# Patient Record
Sex: Female | Born: 1985 | Race: Black or African American | Hispanic: No | Marital: Married | Smoking: Former smoker
Health system: Southern US, Community
[De-identification: ages and names within clinical notes are randomized; demographics above are authoritative.]

## PROBLEM LIST (undated history)

## (undated) DIAGNOSIS — M549 Dorsalgia, unspecified: Secondary | ICD-10-CM

## (undated) DIAGNOSIS — M4304 Spondylolysis, thoracic region: Secondary | ICD-10-CM

## (undated) DIAGNOSIS — R110 Nausea: Secondary | ICD-10-CM

## (undated) DIAGNOSIS — S134XXA Sprain of ligaments of cervical spine, initial encounter: Secondary | ICD-10-CM

## (undated) DIAGNOSIS — T8131XA Disruption of external operation (surgical) wound, not elsewhere classified, initial encounter: Secondary | ICD-10-CM

## (undated) DIAGNOSIS — G894 Chronic pain syndrome: Secondary | ICD-10-CM

## (undated) DIAGNOSIS — G8929 Other chronic pain: Secondary | ICD-10-CM

## (undated) DIAGNOSIS — C801 Malignant (primary) neoplasm, unspecified: Secondary | ICD-10-CM

## (undated) DIAGNOSIS — Z923 Personal history of irradiation: Secondary | ICD-10-CM

## (undated) HISTORY — DX: Nausea: R11.0

## (undated) HISTORY — PX: CENTRAL VENOUS CATHETER TUNNELED INSERTION SINGLE LUMEN: SHX1325

---

## 2015-04-22 ENCOUNTER — Emergency Department (HOSPITAL_COMMUNITY)
Admission: EM | Admit: 2015-04-22 | Discharge: 2015-04-22 | Disposition: A | Discharge status: Home / Self Care | Payer: Medicaid Other | Attending: Emergency Medicine | Admitting: Emergency Medicine

## 2015-04-22 ENCOUNTER — Emergency Department (HOSPITAL_COMMUNITY): Payer: Medicaid Other

## 2015-04-22 DIAGNOSIS — Z3202 Encounter for pregnancy test, result negative: Secondary | ICD-10-CM | POA: Insufficient documentation

## 2015-04-22 DIAGNOSIS — F419 Anxiety disorder, unspecified: Secondary | ICD-10-CM | POA: Diagnosis not present

## 2015-04-22 DIAGNOSIS — R079 Chest pain, unspecified: Secondary | ICD-10-CM | POA: Insufficient documentation

## 2015-04-22 DIAGNOSIS — R55 Syncope and collapse: Secondary | ICD-10-CM | POA: Diagnosis not present

## 2015-04-22 LAB — CBC WITH DIFFERENTIAL/PLATELET
BASOS ABS: 0 10*3/uL (ref 0.0–0.1)
BASOS PCT: 0 %
EOS PCT: 1 %
Eosinophils Absolute: 0.1 10*3/uL (ref 0.0–0.7)
HCT: 37.8 % (ref 36.0–46.0)
Hemoglobin: 12.2 g/dL (ref 12.0–15.0)
LYMPHS PCT: 16 %
Lymphs Abs: 1 10*3/uL (ref 0.7–4.0)
MCH: 25.1 pg — ABNORMAL LOW (ref 26.0–34.0)
MCHC: 32.3 g/dL (ref 30.0–36.0)
MCV: 77.8 fL — AB (ref 78.0–100.0)
MONO ABS: 0.3 10*3/uL (ref 0.1–1.0)
Monocytes Relative: 4 %
Neutro Abs: 5.3 10*3/uL (ref 1.7–7.7)
Neutrophils Relative %: 79 %
PLATELETS: 341 10*3/uL (ref 150–400)
RBC: 4.86 MIL/uL (ref 3.87–5.11)
RDW: 20 % — AB (ref 11.5–15.5)
WBC: 6.7 10*3/uL (ref 4.0–10.5)

## 2015-04-22 LAB — URINE MICROSCOPIC-ADD ON

## 2015-04-22 LAB — URINALYSIS, ROUTINE W REFLEX MICROSCOPIC
Bilirubin Urine: NEGATIVE
GLUCOSE, UA: NEGATIVE mg/dL
HGB URINE DIPSTICK: NEGATIVE
Ketones, ur: NEGATIVE mg/dL
Nitrite: NEGATIVE
PH: 7.5 (ref 5.0–8.0)
PROTEIN: NEGATIVE mg/dL
SPECIFIC GRAVITY, URINE: 1.012 (ref 1.005–1.030)
Urobilinogen, UA: 0.2 mg/dL (ref 0.0–1.0)

## 2015-04-22 LAB — POC URINE PREG, ED: Preg Test, Ur: NEGATIVE

## 2015-04-22 LAB — COMPREHENSIVE METABOLIC PANEL
ALBUMIN: 4.2 g/dL (ref 3.5–5.0)
ALT: 19 U/L (ref 14–54)
AST: 22 U/L (ref 15–41)
Alkaline Phosphatase: 84 U/L (ref 38–126)
Anion gap: 10 (ref 5–15)
BUN: 12 mg/dL (ref 6–20)
CHLORIDE: 106 mmol/L (ref 101–111)
CO2: 22 mmol/L (ref 22–32)
CREATININE: 0.64 mg/dL (ref 0.44–1.00)
Calcium: 9.2 mg/dL (ref 8.9–10.3)
GFR calc Af Amer: >60 mL/min (ref >60)
GLUCOSE: 80 mg/dL (ref 65–99)
POTASSIUM: 4.1 mmol/L (ref 3.5–5.1)
SODIUM: 138 mmol/L (ref 135–145)
Total Bilirubin: 0.9 mg/dL (ref 0.3–1.2)
Total Protein: 8.1 g/dL (ref 6.5–8.1)

## 2015-04-22 LAB — I-STAT TROPONIN, ED: Troponin i, poc: 0 ng/mL (ref 0.00–0.08)

## 2015-04-22 MED ORDER — SODIUM CHLORIDE 0.9 % IV BOLUS (SEPSIS): 1000.0000 mL | Freq: Once | INTRAVENOUS | Status: DC

## 2015-04-22 NOTE — Discharge Instructions (Signed)
Your workup in the emergency room today was unremarkable. Please follow-up with your primary care provider within one week. Follow-up with [facility] surgery next week as scheduled. If you have any concerning symptoms such as high fever, difficulty breathing, seizures, etc. Return to the ER immediately.  Please obtain all of your results from medical records or have your doctors office obtain the results - share them with your doctor - you should be seen at your doctors office in the next 2 days. Call today to arrange your follow up. Take the medications as prescribed. Please review all of the medicines and only take them if you do not have an allergy to them. Please be aware that if you are taking birth control pills, taking other prescriptions, ESPECIALLY ANTIBIOTICS may make the birth control ineffective - if this is the case, either do not engage in sexual activity or use alternative methods of birth control such as condoms until you have finished the medicine and your family doctor says it is OK to restart them. If you are on a blood thinner such as COUMADIN, be aware that any other medicine that you take may cause the coumadin to either work too much, or not enough - you should have your coumadin level rechecked in next 7 days if this is the case.  ?  It is also a possibility that you have an allergic reaction to any of the medicines that you have been prescribed - Everybody reacts differently to medications and while MOST people have no trouble with most medicines, you may have a reaction such as nausea, vomiting, rash, swelling, shortness of breath. If this is the case, please stop taking the medicine immediately and contact your physician.  ?  You should return to the ER if you develop severe or worsening symptoms.

## 2015-04-22 NOTE — ED Provider Notes (Signed)
CSN: [id number]     Arrival date & time 04/22/15  1220 History   First MD Initiated Contact with Patient 04/22/15 1221     Chief Complaint  Patient presents with  . Chest Pain  . Anxiety    HPI  Katrina Aguilar is an 29 y.o. female with history of mediastinal mass who presents to the ED for evaluation of pre-syncope. She states that she was in her usual state of health until this morning when she was sitting in bed texting when she suddenly felt lightheaded, got tunnel vision, had clammy palms, and felt like she was going to pass out. At the same time, she reports she had some chest tightness and chest pressure. She states that she then laid down for a couple minutes and when she sat back up the symptoms returned. She states that the entire episode lasted maybe 10 minutes. Katrina Aguilar reports she has a known mediastinal mass that was first diagnosed in January of this year. She states that since then she will feel twinges/pangs in her chest with chest tightness and difficulty breathing. She reports that usually the episodes are tolerable but she went to the [facility] ED last week for a severe flare. Katrina Aguilar states that before this morning's episode, she was having her baseline chest tightness but it was not increased or extremely painful. She does not think she lost consciousness at any point. Denies fever, chills, abdominal pain, N/V/D. She does report she has had ongoing increased stress and anxiety over the past few months following the passing of her sister, the birth of her daughter, and her new mediastinal mass. She states that she did not feel particularly anxious or stressed this morning, however. Denies history of panic attacks or syncope. Pt does admit decreased appetite and PO intake 2/2 increased stress recently.   Review of outside records reveals CT from 04/12/15 showing:  1. No evidence of pulmonary embolus or right heart strain. Prominent central pulmonary artery trunk with no evidence of  central pulmonary artery enlargement. 2. Large anterior and right-sided mediastinal mass. Differential diagnosis includes thymoma, teratoma, lymphoma, and less likely extra medullary hematopoiesis 3. Small right greater than left pleural effusions and peribronchial streaky opacities bilateral lower lobes subsegmental atelectasis or infiltrate  Katrina Aguilar reports she has f/u with [facility] surgery on 11/2 for further management of her mass, which has grown in size since initial presentation.   No past medical history on file. No past surgical history on file. No family history on file. Social History  Substance Use Topics  . Smoking status: Not on file  . Smokeless tobacco: Not on file  . Alcohol Use: Not on file   OB History    No data available     Review of Systems  All other systems reviewed and are negative.     Allergies  Review of patient's allergies indicates no known allergies.  Home Medications   Prior to Admission medications   Medication Sig Start Date End Date Taking? Authorizing Provider  acetaminophen (TYLENOL) 500 MG tablet Take 1,000 mg by mouth every 6 (six) hours as needed for moderate pain.   Yes Historical Provider, MD   BP 120/76 mmHg  Pulse 67  Temp(Src) 98.2 F (36.8 C) (Oral)  Resp 18  Ht 5\' 2"  (1.575 m)  Wt 133 lb (60.328 kg)  BMI 24.32 kg/m2  SpO2 99% Physical Exam  Constitutional: She is oriented to person, place, and time. No distress.  HENT:  Right  Ear: External ear normal.  Left Ear: External ear normal.  Nose: Nose normal.  Mouth/Throat: Oropharynx is clear and moist. No oropharyngeal exudate.  Eyes: Conjunctivae and EOM are normal. Pupils are equal, round, and reactive to light.  Neck: Normal range of motion. Neck supple.  Cardiovascular: Normal rate, regular rhythm, normal heart sounds and intact distal pulses.   No murmur heard. Pulmonary/Chest: Effort normal and breath sounds normal. No respiratory distress. She has no  wheezes.  Abdominal: Soft. Bowel sounds are normal. She exhibits no distension. There is no tenderness. There is no rebound and no guarding.  Musculoskeletal: Normal range of motion. She exhibits no edema or tenderness.  Lymphadenopathy:    She has no cervical adenopathy.  Neurological: She is alert and oriented to person, place, and time. She has normal reflexes. No cranial nerve deficit or sensory deficit. She displays a negative Romberg sign. Gait normal.  Skin: Skin is warm and dry. She is not diaphoretic.  Nursing note and vitals reviewed.   ED Course  Procedures (including critical care time) Labs Review Labs Reviewed  CBC WITH DIFFERENTIAL/PLATELET - Abnormal; Notable for the following:    MCV 77.8 (*)    MCH 25.1 (*)    RDW 20.0 (*)    All other components within normal limits  COMPREHENSIVE METABOLIC PANEL  URINALYSIS, ROUTINE W REFLEX MICROSCOPIC (NOT AT [facility])  I-STAT TROPOININ, ED  POC URINE PREG, ED    Imaging Review No results found. I have personally reviewed and evaluated these images and lab results as part of my medical decision-making.   EKG Interpretation   Date/Time:  Friday April 22 2015 14:12:48 EDT Ventricular Rate:  70 PR Interval:  129 QRS Duration: 84 QT Interval:  418 QTC Calculation: 451 R Axis:   43 Text Interpretation:  Sinus rhythm Normal ECG Confirmed by Maryan Rued  MD,  Loree Fee ([id number]) on 04/22/2015 2:37:25 PM      MDM   Final diagnoses:  Pre-syncope  Anxiety  Chest pain, unspecified chest pain type    Likely anxiety vs vasovagal syncope 2/2 chest discomfort. However will work up for chest pain / syncope with EKG, CXR, troponin, cbc, cmp, UA. CT scan from [facility] last week shows mediastinal mass but no evidence of PE or R heart strain which is reassuring. Pending lab workup I anticipate dispo home and keep f/u with [facility] next week.   Labs unremarkable. Cardiac workup negative. Discussed with pt. She [facility] as scheduled. Encouraged f/u with PCP for referral to psych/therapy as needed for anxiety and increased life stressors. Return precautions given.     Anne Ng, PA-C 04/22/15 [place], MD 04/27/15 [phone]

## 2015-04-22 NOTE — ED Notes (Signed)
Bed: TT01 Expected date:  Expected time:  Means of arrival:  Comments: EMS- 20s, Anxiety

## 2015-04-22 NOTE — ED Notes (Signed)
Patient c/o feeling flushed, chest tingling, and feeling room spinning while texting earlier today. Called EMS. Endorses feeling anxious and depressed due recent death of her sister and recent birth of her 2nd child. She has good support system at home. Denies suicidality and homicidality. No chest pain or SOB on arrival. Was recently diagnosed with mass on her mediastinum and is receiving care in [place]. She has had imaging done but no follow-up. She states she is worried about the outcome of this mass.

## 2015-05-16 HISTORY — PX: BRONCHOSCOPY: SUR163

## 2015-05-16 HISTORY — PX: OTHER SURGICAL HISTORY: SHX169

## 2015-06-12 ENCOUNTER — Emergency Department (HOSPITAL_COMMUNITY)
Admission: EM | Admit: 2015-06-12 | Discharge: 2015-06-12 | Disposition: A | Discharge status: Home / Self Care | Payer: Medicaid Other | Attending: Emergency Medicine | Admitting: Emergency Medicine

## 2015-06-12 ENCOUNTER — Encounter (HOSPITAL_COMMUNITY): Payer: Self-pay | Admitting: Emergency Medicine

## 2015-06-12 DIAGNOSIS — T814XXA Infection following a procedure, initial encounter: Secondary | ICD-10-CM | POA: Insufficient documentation

## 2015-06-12 DIAGNOSIS — R079 Chest pain, unspecified: Secondary | ICD-10-CM | POA: Insufficient documentation

## 2015-06-12 DIAGNOSIS — L089 Local infection of the skin and subcutaneous tissue, unspecified: Secondary | ICD-10-CM

## 2015-06-12 DIAGNOSIS — Z859 Personal history of malignant neoplasm, unspecified: Secondary | ICD-10-CM | POA: Insufficient documentation

## 2015-06-12 DIAGNOSIS — T148XXA Other injury of unspecified body region, initial encounter: Secondary | ICD-10-CM

## 2015-06-12 DIAGNOSIS — Y658 Other specified misadventures during surgical and medical care: Secondary | ICD-10-CM | POA: Insufficient documentation

## 2015-06-12 HISTORY — DX: Malignant (primary) neoplasm, unspecified: C80.1

## 2015-06-12 LAB — CBC
HCT: 34.7 % — ABNORMAL LOW (ref 36.0–46.0)
Hemoglobin: 11.3 g/dL — ABNORMAL LOW (ref 12.0–15.0)
MCH: 27.4 pg (ref 26.0–34.0)
MCHC: 32.6 g/dL (ref 30.0–36.0)
MCV: 84 fL (ref 78.0–100.0)
PLATELETS: 198 10*3/uL (ref 150–400)
RBC: 4.13 MIL/uL (ref 3.87–5.11)
RDW: 16.7 % — AB (ref 11.5–15.5)
WBC: 2.4 10*3/uL — ABNORMAL LOW (ref 4.0–10.5)

## 2015-06-12 MED ORDER — OXYCODONE-ACETAMINOPHEN 5-325 MG PO TABS: 1.0000 | ORAL_TABLET | Freq: Four times a day (QID) | ORAL | Status: DC | PRN

## 2015-06-12 MED ORDER — SULFAMETHOXAZOLE-TRIMETHOPRIM 800-160 MG PO TABS: 1.0000 | ORAL_TABLET | Freq: Two times a day (BID) | ORAL | Status: AC

## 2015-06-12 MED ORDER — SULFAMETHOXAZOLE-TRIMETHOPRIM 800-160 MG PO TABS
1.0000 | ORAL_TABLET | Freq: Once | ORAL | Status: AC
  Administered 2015-06-12: 1 via ORAL
  Filled 2015-06-12: qty 1

## 2015-06-12 MED ORDER — OXYCODONE-ACETAMINOPHEN 5-325 MG PO TABS
1.0000 | ORAL_TABLET | Freq: Once | ORAL | Status: AC
  Administered 2015-06-12: 1 via ORAL
  Filled 2015-06-12: qty 1

## 2015-06-12 NOTE — Discharge Instructions (Signed)
Please read and follow all provided instructions.  Your diagnoses today include:  1. Wound infection ([place])    Tests performed today include:  Vital signs. See below for your results today.   Wound culture - results pending  White blood cell count - 2.4  Medications prescribed:   Bactrim (trimethoprim/sulfamethoxazole) - antibiotic  You have been prescribed an antibiotic medicine: take the entire course of medicine even if you are feeling better. Stopping early can cause the antibiotic not to work.   Percocet (oxycodone/acetaminophen) - narcotic pain medication  DO NOT drive or perform any activities that require you to be awake and alert because this medicine can make you drowsy. BE VERY CAREFUL not to take multiple medicines containing Tylenol (also called acetaminophen). Doing so can lead to an overdose which can damage your liver and cause liver failure and possibly death.  Take any prescribed medications only as directed.   Home care instructions:  Follow any educational materials contained in this packet. Keep affected area above the level of your heart when possible. Wash area gently twice a day with warm soapy water. Do not apply alcohol or hydrogen peroxide. Cover the area if it draining or weeping.   Follow-up instructions: Call your surgeon's office tomorrow for an appointment on Tuesday.   Return instructions:  Go to the [facility] Emergency Department if you have:  Fever  Worsening symptoms  Worsening pain  Worsening swelling  Redness of the skin that moves away from the affected area, especially if it streaks away from the affected area   Any other emergent concerns  Your vital signs today were: BP 133/87 mmHg   Pulse 68   Temp(Src) 98.2 F (36.8 C) (Oral)   Resp 18   Ht 5\' 2"  (1.575 m)   Wt 60.328 kg   BMI 24.32 kg/m2   SpO2 100% If your blood pressure (BP) was elevated above 135/85 this visit, please have this repeated by your doctor within one  month. --------------

## 2015-06-12 NOTE — ED Provider Notes (Signed)
CSN: [id number]     Arrival date & time 06/12/15  [id number] History   First MD Initiated Contact with Patient 06/12/15 0902     Chief Complaint  Patient presents with  . Vascular Access Problem     (Consider location/radiation/quality/duration/timing/severity/associated sxs/prior Treatment) HPI Comments: Patient with recent diagnosis of large B-cell lymphoma, followed by [facility] (Dr. Bosie Clos heme/onc, Dr. Dema Severin general surgery), status post tunneled catheter placement on 05/30/15 -- presents with complaint of drainage from her right chest incision were catheter was placed. Patient had noted increased burning sensation in the area. Small morning, she noted that the lateral half of the wound had dehisced. There is been a small amount of drainage on the dressing that she covered it with. No fevers or chills. No nausea or vomiting. She has been in contact with her doctors at [facility]. The onset of this condition was acute. The course is constant. Aggravating factors: none. Alleviating factors: none.    The history is provided by the patient and medical records.    Past Medical History  Diagnosis Date  . Cancer ([place])    No past surgical history on file. No family history on file. Social History  Substance Use Topics  . Smoking status: Never Smoker   . Smokeless tobacco: None  . Alcohol Use: Yes   OB History    No data available     Review of Systems  Constitutional: Negative for fever.  HENT: Negative for rhinorrhea and sore throat.   Eyes: Negative for redness.  Respiratory: Negative for cough.   Cardiovascular: Positive for chest pain.  Gastrointestinal: Negative for nausea, vomiting, abdominal pain and diarrhea.  Genitourinary: Negative for dysuria.  Musculoskeletal: Negative for myalgias.  Skin: Positive for wound. Negative for color change and rash.  Neurological: Negative for headaches.      Allergies  Review of patient's allergies indicates no known allergies.  Home Medications     Prior to Admission medications   Medication Sig Start Date End Date Taking? Authorizing Provider  acetaminophen (TYLENOL) 500 MG tablet Take 1,000 mg by mouth every 6 (six) hours as needed for moderate pain.    Historical Provider, MD   BP 136/98 mmHg  Pulse 77  Temp(Src) 98.2 F (36.8 C) (Oral)  Resp 18  Ht 5\' 2"  (1.575 m)  Wt 60.328 kg  BMI 24.32 kg/m2  SpO2 100% Physical Exam  Constitutional: She appears well-developed and well-nourished.  HENT:  Head: Normocephalic and atraumatic.  Eyes: Conjunctivae are normal. Right eye exhibits no discharge. Left eye exhibits no discharge.  Neck: Normal range of motion. Neck supple.  Cardiovascular: Normal rate, regular rhythm and normal heart sounds.   Pulmonary/Chest: Effort normal and breath sounds normal. She exhibits tenderness.    Abdominal: Soft. There is no tenderness.  Neurological: She is alert.  Skin: Skin is warm and dry.  Psychiatric: She has a normal mood and affect.  Nursing note and vitals reviewed.   ED Course  Procedures (including critical care time) Labs Review Labs Reviewed  CBC - Abnormal; Notable for the following:    WBC 2.4 (*)    Hemoglobin 11.3 (*)    HCT 34.7 (*)    RDW 16.7 (*)    All other components within normal limits  WOUND CULTURE    Imaging Review No results found. I have personally reviewed and evaluated these images and lab results as part of my medical decision-making.   EKG Interpretation None       9:32 AM  Patient seen and examined. Discussed with Dr. Venora Maples. Will speak with heme/onc at [facility] to formulate treatment/follow-up plan.   Vital signs reviewed and are as follows: BP 136/98 mmHg  Pulse 77  Temp(Src) 98.2 F (36.8 C) (Oral)  Resp 18  Ht 5\' 2"  (1.575 m)  Wt 60.328 kg  BMI 24.32 kg/m2  SpO2 100%  11:13 AM Dr. Venora Maples spoke with on-call for patient's surgeon. Plan: d/c to home on abx, call tomorrow for surgery f/u on Tuesday.   Pt urged to go to [facility] ED  with worsening pain, worsening swelling, expanding area of redness or streaking up extremity, fever, or any other concerns. Urged to take complete course of antibiotics as prescribed. Counseled to take pain medications as prescribed. Pt verbalizes understanding and agrees with plan.  Patient counseled on use of narcotic pain medications. Counseled not to combine these medications with others containing tylenol. Urged not to drink alcohol, drive, or perform any other activities that requires focus while taking these medications. The patient verbalizes understanding and agrees with the plan.    MDM   Final diagnoses:  Wound infection ([place])   Patient with the assistance of minor infection of incision site where right IJ Port-A-Cath was implanted on 12/5. No systemic symptoms or other indications for admission at this time. Obtained follow-up for patient within the next 48 hours for recheck and further management from her surgeon.   Carlisle Cater, PA-C 06/12/15 [place], MD 06/12/15 1134

## 2015-06-12 NOTE — ED Notes (Addendum)
Recent right chest port-a-cath implanted for chemo, sts incision site partially opened pta, scant drainage noted, denies fever or other complaints, NAD

## 2015-06-12 NOTE — ED Notes (Signed)
Attempted wound culture with MD at bedside and no sample was visible to culture.  MD agreed and culture was no obtained.

## 2015-06-15 LAB — WOUND CULTURE
CULTURE: NO GROWTH
GRAM STAIN: NONE SEEN

## 2015-10-10 DIAGNOSIS — G894 Chronic pain syndrome: Secondary | ICD-10-CM | POA: Insufficient documentation

## 2015-10-10 DIAGNOSIS — M4300 Spondylolysis, site unspecified: Secondary | ICD-10-CM | POA: Insufficient documentation

## 2015-10-20 ENCOUNTER — Encounter: Payer: Self-pay | Admitting: Radiation Oncology

## 2015-10-20 NOTE — Progress Notes (Signed)
Thoracic Location of Tumor / Histology:  05/16/15 Mediastinal mass, biopsy: Diffuse large B-cell lymphoma  Patient presented 07/07/14 with symptoms of: Acute onset midsternal chest pain.  Biopsies of Mediastinal mass revealed:  05/16/15 Bronchoscoopy and Thoracoscopic biopsy demonstrated DLBCL. Bronchoscopically there was subtle thickening and inflammation of the tracheobronchial mucosa at the junction of the RUL and BI.  Tobacco/Marijuana/Snuff/ETOH use: She is a former smoker. She smoked 1/2 pack a day for 6 years and quit in 2012.  Past/Anticipated interventions by cardiothoracic surgery, if any:  05/16/15 Bronchoscopy and Thoracoscopy, diagnostic; mediastinal space, with biopsy by Dr. Marcelline Deist MD  Past/Anticipated interventions by medical oncology, if any:  09/14/15 Dr. Lessie Dings: The patient's case was discussed in the [facility] tumor board and it was felt that the residual activity within the PET scan was enough to warrant a full 5-6 cycles of CHOP. Plan after 5 cycles to repeat her PET scan should she have complete response we would then consider an abridged radiation course. She will receive chemo today. Treatment number 5 of potential 6.   10/05/15 Dr. Lessie Dings: The patient's case was discussed in the [facility] tumor board and it was felt that the residual activity within the PET scan was enough to warrant a full 5-6 cycles of CHOP. I reviewed with her the results of her PET scan which is indicated really no big change from the previous PET scan and discussed her case with radiation oncology I think it may be reasonable to consider the consolidative radiation at this point in time given that the area has not changed much in intensity despite an additional 3 cycles of chemotherapy.   Signs/Symptoms Weight changes, if any: She reports this weight happened after she cut out red meat and pork. She does not relate it to chemotherapy. Wt Readings from Last 3 Encounters:   10/24/15 122 lb 8 oz (55.566 kg)  06/12/15 133 lb (60.328 kg)  04/22/15 133 lb (60.328 kg)    Respiratory complaints, if any: No, she does report some chest pain occasionally, but states she feels like her breathing is good.   Hemoptysis, if any: No  Pain issues, if any:  She has mid back pain. She has been referred to a pain clinic, and reports her appointment is in several weeks. She is attempting to find primary care doctor to help manage her pain.   SAFETY ISSUES:  Prior radiation? No  Pacemaker/ICD?  No  Possible current pregnancy? No, she had a recent menstrual cycle that ended April 25th.  Is the patient on methotrexate? No  Current Complaints / other details:    BP 121/83 mmHg  Pulse 82  Temp(Src) 97.8 F (36.6 C)  Ht 5\' 2"  (1.575 m)  Wt 122 lb 8 oz (55.566 kg)  BMI 22.40 kg/m2  SpO2 100%

## 2015-10-24 ENCOUNTER — Ambulatory Visit
Admission: RE | Admit: 2015-10-24 | Discharge: 2015-10-24 | Disposition: A | Discharge status: Home / Self Care | Payer: BLUE CROSS/BLUE SHIELD | Source: Ambulatory Visit | Attending: Radiation Oncology | Admitting: Radiation Oncology

## 2015-10-24 ENCOUNTER — Encounter: Payer: Self-pay | Admitting: Radiation Oncology

## 2015-10-24 VITALS — BP 121/83 | HR 82 | Temp 97.8°F | Ht 62.0 in | Wt 122.5 lb

## 2015-10-24 DIAGNOSIS — M47814 Spondylosis without myelopathy or radiculopathy, thoracic region: Secondary | ICD-10-CM | POA: Diagnosis not present

## 2015-10-24 DIAGNOSIS — M545 Low back pain: Secondary | ICD-10-CM | POA: Diagnosis not present

## 2015-10-24 DIAGNOSIS — C8520 Mediastinal (thymic) large B-cell lymphoma, unspecified site: Secondary | ICD-10-CM

## 2015-10-24 DIAGNOSIS — Z807 Family history of other malignant neoplasms of lymphoid, hematopoietic and related tissues: Secondary | ICD-10-CM | POA: Diagnosis not present

## 2015-10-24 DIAGNOSIS — Z87891 Personal history of nicotine dependence: Secondary | ICD-10-CM | POA: Insufficient documentation

## 2015-10-24 DIAGNOSIS — Z803 Family history of malignant neoplasm of breast: Secondary | ICD-10-CM | POA: Diagnosis not present

## 2015-10-24 DIAGNOSIS — G8929 Other chronic pain: Secondary | ICD-10-CM | POA: Diagnosis not present

## 2015-10-24 DIAGNOSIS — Z510 Encounter for antineoplastic radiation therapy: Secondary | ICD-10-CM | POA: Insufficient documentation

## 2015-10-24 HISTORY — DX: Spondylolysis, thoracic region: M43.04

## 2015-10-24 HISTORY — DX: Other chronic pain: G89.29

## 2015-10-24 HISTORY — DX: Disruption of external operation (surgical) wound, not elsewhere classified, initial encounter: T81.31XA

## 2015-10-24 HISTORY — DX: Sprain of ligaments of cervical spine, initial encounter: S13.4XXA

## 2015-10-24 HISTORY — DX: Dorsalgia, unspecified: M54.9

## 2015-10-24 HISTORY — DX: Chronic pain syndrome: G89.4
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
## 2015-10-25 DIAGNOSIS — C8520 Mediastinal (thymic) large B-cell lymphoma, unspecified site: Secondary | ICD-10-CM | POA: Insufficient documentation

## 2015-10-26 ENCOUNTER — Telehealth: Payer: Self-pay

## 2015-10-26 NOTE — Telephone Encounter (Signed)
I called and spoke to Katrina Aguilar this morning about her upcoming radiation treatments. I emphasized the importance of using birth control and not becoming pregnant while receiving radiation. She voiced her understanding of risks of radiation and pregnancy. She is aware that she needs to come in for a pregnancy test before her CT simulation on Monday. She knows to call me if she has any further questions.

## 2015-10-27 ENCOUNTER — Other Ambulatory Visit: Payer: Self-pay

## 2015-10-27 DIAGNOSIS — C8520 Mediastinal (thymic) large B-cell lymphoma, unspecified site: Secondary | ICD-10-CM

## 2015-10-31 ENCOUNTER — Ambulatory Visit
Admission: RE | Admit: 2015-10-31 | Discharge: 2015-10-31 | Disposition: A | Discharge status: Home / Self Care | Payer: BLUE CROSS/BLUE SHIELD | Source: Ambulatory Visit | Attending: Radiation Oncology | Admitting: Radiation Oncology

## 2015-10-31 ENCOUNTER — Other Ambulatory Visit (HOSPITAL_COMMUNITY)
Admission: RE | Admit: 2015-10-31 | Discharge: 2015-10-31 | Disposition: A | Discharge status: Home / Self Care | Payer: BLUE CROSS/BLUE SHIELD | Source: Ambulatory Visit | Attending: Radiation Oncology | Admitting: Radiation Oncology

## 2015-10-31 VITALS — BP 123/90 | HR 112 | Temp 98.4°F | Ht 62.0 in | Wt 120.1 lb

## 2015-10-31 DIAGNOSIS — C8520 Mediastinal (thymic) large B-cell lymphoma, unspecified site: Secondary | ICD-10-CM | POA: Insufficient documentation

## 2015-10-31 DIAGNOSIS — Z510 Encounter for antineoplastic radiation therapy: Secondary | ICD-10-CM | POA: Diagnosis not present

## 2015-10-31 LAB — BUN AND CREATININE (CC13)
BUN: 11 mg/dL (ref 7.0–26.0)
Creatinine: 0.9 mg/dL (ref 0.6–1.1)

## 2015-10-31 LAB — PREGNANCY, URINE: PREG TEST UR: NEGATIVE

## 2015-10-31 MED ORDER — SODIUM CHLORIDE 0.9% FLUSH
10.0000 mL | Freq: Once | INTRAVENOUS | Status: AC
  Administered 2015-10-31: 10 mL via INTRAVENOUS

## 2015-10-31 NOTE — Progress Notes (Signed)
  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 Mediastinal (thymic) large B-cell lymphoma, unspecified body region [facility]) 202.80 C85.20     NARRATIVE:  The patient was brought to the [facility].  Identity was confirmed.  All relevant records and images related to the planned course of therapy were reviewed.  The patient freely provided informed written consent to proceed with treatment after reviewing the details related to the planned course of therapy. The consent form was witnessed and verified by the simulation staff.    Then, the patient was set-up in a stable reproducible  supine position for radiation therapy. Her breasts were taped laterally to displace them from the midline RT fields.  CT images were obtained without and with contrast.  Surface markings were placed.  The CT images were loaded into the planning software.    Special treatment procedure was performed today due to the extra time and effort required by myself to plan and prepare this patient for deep inspiration breath hold technique.  I have determined cardiac sparing to be of benefit to this patient to prevent long term cardiac damage due to radiation of the heart.  Bellows were placed on the patient's abdomen. To facilitate cardiac sparing, the patient was coached by the radiation therapists on breath hold techniques and breathing practice was performed. Practice waveforms were obtained. The patient was then scanned while maintaining breath hold in the treatment position.  This image was then transferred over to the imaging specialist. The imaging specialist then created a fusion of the free breathing and breath hold scans using  the chest wall as the stable structure. I personally reviewed the fusion in axial, coronal and sagittal image planes.  Excellent cardiac sparing was obtained.  I felt the patient is an appropriate candidate for breath hold and the patient will be treated as such.  The image fusion was then reviewed with the patient to reinforce the necessity of reproducible breath hold.  TREATMENT PLANNING NOTE: Treatment planning then occurred.  The radiation prescription was entered and confirmed.    A total of 2 medically necessary complex treatment devices were fabricated and supervised by me, in the form of 2 fields with MLCs to block lungs and heart. MORE FIELDS WITH MLCs MAY BE ADDED IN DOSIMETRY for dose homogeneity.  I have requested : 3D Simulation  I have requested a DVH of the following structures: heart lungs and target volumes.  I have ordered:PET fusion, social work Consult  The patient will receive 41.4 Gy at 1.8 Gy per fraction to the involved site of the mediastinum.   -----------------------------------  Eppie Gibson, MD

## 2015-10-31 NOTE — Progress Notes (Signed)
Does patient have an allergy to IV contrast dye?: No.   Has patient ever received premedication for IV contrast dye?: No.   Does patient take metformin?: No.  10/26/2015 from [facility] BUN: 11 CR: 0.5  IV site: Left anticubital.

## 2015-11-02 DIAGNOSIS — Z510 Encounter for antineoplastic radiation therapy: Secondary | ICD-10-CM | POA: Diagnosis not present

## 2015-11-03 ENCOUNTER — Encounter: Payer: Self-pay | Admitting: *Deleted

## 2015-11-03 NOTE — Progress Notes (Unsigned)
[facility] Aguilar Work  Clinical Social Work was referred by Pension scheme manager for assessment of psychosocial needs.  Clinical Social Worker contacted patient by phone to offer support and assess for needs.  Mrs. Katrina shared she is feeling somewhat anxious to begin radiation treatment, but otherwise feels she is coping adequately with her cancer diagnosis.  The patient cares for three children under the age of three, she indicated finding childcare is a concern but she relies heavily on the child's grandparents to provide care while she is at the hospital.  CSW provided brief emotional support and described CSW role at cancer center.  CSW encouraged patient to call with any questions or concerns.  Polo Riley, MSW, LCSW, OSW-C Clinical Social Worker [facility] [phone]

## 2015-11-07 ENCOUNTER — Ambulatory Visit
Admission: RE | Admit: 2015-11-07 | Discharge: 2015-11-07 | Disposition: A | Discharge status: Home / Self Care | Payer: BLUE CROSS/BLUE SHIELD | Source: Ambulatory Visit | Attending: Radiation Oncology | Admitting: Radiation Oncology

## 2015-11-07 DIAGNOSIS — Z510 Encounter for antineoplastic radiation therapy: Secondary | ICD-10-CM | POA: Diagnosis not present

## 2015-11-08 ENCOUNTER — Ambulatory Visit
Admission: RE | Admit: 2015-11-08 | Discharge: 2015-11-08 | Disposition: A | Discharge status: Home / Self Care | Payer: BLUE CROSS/BLUE SHIELD | Source: Ambulatory Visit | Attending: Radiation Oncology | Admitting: Radiation Oncology

## 2015-11-08 DIAGNOSIS — C8520 Mediastinal (thymic) large B-cell lymphoma, unspecified site: Secondary | ICD-10-CM

## 2015-11-08 DIAGNOSIS — Z510 Encounter for antineoplastic radiation therapy: Secondary | ICD-10-CM | POA: Diagnosis not present

## 2015-11-08 MED ORDER — RADIAPLEXRX EX GEL
Freq: Once | CUTANEOUS | Status: AC
  Administered 2015-11-08: 12:00:00 via TOPICAL

## 2015-11-08 NOTE — Progress Notes (Signed)
Pt here for patient teaching.  Pt given Radiation and You booklet, skin care instructions and Radiaplex gel. Pt reports they have not watched the Radiation Therapy Education video, but were given the link to watch at home.  Reviewed areas of pertinence such as fatigue, skin changes, throat changes, breast swelling, cough, shortness of breath, earaches and taste changes . Pt able to give teach back of to pat skin, use unscented/gentle soap and drink plenty of water,apply Radiaplex bid, avoid applying anything to skin within 4 hours of treatment and avoid wearing an under wire bra. Pt verbalizes understanding of information given and will contact nursing with any questions or concerns.     Http://[email or web address]/treatmentinformation/whattoexpect/index

## 2015-11-09 ENCOUNTER — Ambulatory Visit: Payer: BLUE CROSS/BLUE SHIELD

## 2015-11-09 ENCOUNTER — Ambulatory Visit
Admission: RE | Admit: 2015-11-09 | Discharge: 2015-11-09 | Disposition: A | Discharge status: Home / Self Care | Payer: BLUE CROSS/BLUE SHIELD | Source: Ambulatory Visit | Attending: Radiation Oncology | Admitting: Radiation Oncology

## 2015-11-09 ENCOUNTER — Ambulatory Visit
Admission: RE | Admit: 2015-11-09 | Payer: BLUE CROSS/BLUE SHIELD | Source: Ambulatory Visit | Admitting: Radiation Oncology

## 2015-11-09 DIAGNOSIS — Z510 Encounter for antineoplastic radiation therapy: Secondary | ICD-10-CM | POA: Diagnosis not present

## 2015-11-10 ENCOUNTER — Ambulatory Visit
Admission: RE | Admit: 2015-11-10 | Discharge: 2015-11-10 | Disposition: A | Discharge status: Home / Self Care | Payer: BLUE CROSS/BLUE SHIELD | Source: Ambulatory Visit | Attending: Radiation Oncology | Admitting: Radiation Oncology

## 2015-11-10 DIAGNOSIS — Z510 Encounter for antineoplastic radiation therapy: Secondary | ICD-10-CM | POA: Diagnosis not present

## 2015-11-11 ENCOUNTER — Ambulatory Visit
Admission: RE | Admit: 2015-11-11 | Discharge: 2015-11-11 | Disposition: A | Discharge status: Home / Self Care | Payer: BLUE CROSS/BLUE SHIELD | Source: Ambulatory Visit | Attending: Radiation Oncology | Admitting: Radiation Oncology

## 2015-11-11 DIAGNOSIS — Z510 Encounter for antineoplastic radiation therapy: Secondary | ICD-10-CM | POA: Diagnosis not present

## 2015-11-14 ENCOUNTER — Ambulatory Visit
Admission: RE | Admit: 2015-11-14 | Discharge: 2015-11-14 | Disposition: A | Discharge status: Home / Self Care | Payer: BLUE CROSS/BLUE SHIELD | Source: Ambulatory Visit | Attending: Radiation Oncology | Admitting: Radiation Oncology

## 2015-11-14 VITALS — BP 118/78 | HR 92 | Temp 97.9°F | Ht 62.0 in | Wt 117.3 lb

## 2015-11-14 DIAGNOSIS — C8520 Mediastinal (thymic) large B-cell lymphoma, unspecified site: Secondary | ICD-10-CM

## 2015-11-14 DIAGNOSIS — Z510 Encounter for antineoplastic radiation therapy: Secondary | ICD-10-CM | POA: Diagnosis not present

## 2015-11-14 MED ORDER — LIDOCAINE VISCOUS 2 % MT SOLN: OROMUCOSAL | Status: DC

## 2015-11-14 NOTE — Progress Notes (Signed)
Katrina Aguilar has completed 5 fractions to her mediastinum.  She denies having pain, shortness of breath, cough, sore throat or trouble swallowing.  She also denies having fatigue and skin irritation.  She has not started using radiaplex yet.  She reports feeling full fast when eating.  She reports vomiting last Thursday.  She takes compazine as needed.  She has lost 3 lbs since 10/31/15.  BP 118/78 mmHg  Pulse 92  Temp(Src) 97.9 F (36.6 C) (Oral)  Ht 5\' 2"  (1.575 m)  Wt 117 lb 4.8 oz (53.207 kg)  BMI 21.45 kg/m2  SpO2 100%   Wt Readings from Last 3 Encounters:  11/14/15 117 lb 4.8 oz (53.207 kg)  10/31/15 120 lb 1.6 oz (54.477 kg)  10/24/15 122 lb 8 oz (55.566 kg)

## 2015-11-14 NOTE — Progress Notes (Signed)
   Weekly Management Note:  outpatient    ICD-9-CM ICD-10-CM   1. Mediastinal (thymic) large B-cell lymphoma, unspecified body region ([place]) 202.80 C85.20 lidocaine (XYLOCAINE) 2 % solution     Ambulatory referral to Nutrition and Diabetic Education    Current Dose:  9 Gy  Projected Dose: 41.4 Gy   Narrative:  The patient presents for routine under treatment assessment.  CBCT/MVCT images/Port film x-rays were reviewed.  The chart was checked. Katrina Aguilar has completed 5 fractions to her mediastinum.  She denies having pain, shortness of breath, cough, sore throat or trouble swallowing.  She also denies having fatigue and skin irritation.  She has not started using radiaplex yet.  She reports feeling full fast when eating.  She reports vomiting last Thursday.  She takes compazine as needed.  She has lost 3 lbs since 10/31/15.    Physical Findings:  Wt Readings from Last 3 Encounters:  11/14/15 117 lb 4.8 oz (53.207 kg)  10/31/15 120 lb 1.6 oz (54.477 kg)  10/24/15 122 lb 8 oz (55.566 kg)    height is 5\' 2"  (1.575 m) and weight is 117 lb 4.8 oz (53.207 kg). Her oral temperature is 97.9 F (36.6 C). Her blood pressure is 118/78 and her pulse is 92. Her oxygen saturation is 100%.  NAD, well appearing  CBC    Component Value Date/Time   WBC 2.4* 06/12/2015 0930   RBC 4.13 06/12/2015 0930   HGB 11.3* 06/12/2015 0930   HCT 34.7* 06/12/2015 0930   PLT 198 06/12/2015 0930   MCV 84.0 06/12/2015 0930   MCH 27.4 06/12/2015 0930   MCHC 32.6 06/12/2015 0930   RDW 16.7* 06/12/2015 0930   LYMPHSABS 1.0 04/22/2015 1353   MONOABS 0.3 04/22/2015 1353   EOSABS 0.1 04/22/2015 1353   BASOSABS 0.0 04/22/2015 1353     CMP     Component Value Date/Time   NA 138 04/22/2015 1353   K 4.1 04/22/2015 1353   CL 106 04/22/2015 1353   CO2 22 04/22/2015 1353   GLUCOSE 80 04/22/2015 1353   BUN 11.0 10/31/2015 0825   BUN 12 04/22/2015 1353   CREATININE 0.9 10/31/2015 0825   CREATININE 0.64  04/22/2015 1353   CALCIUM 9.2 04/22/2015 1353   PROT 8.1 04/22/2015 1353   ALBUMIN 4.2 04/22/2015 1353   AST 22 04/22/2015 1353   ALT 19 04/22/2015 1353   ALKPHOS 84 04/22/2015 1353   BILITOT 0.9 04/22/2015 1353   GFRNONAA >60 04/22/2015 1353   GFRAA >60 04/22/2015 1353     Impression:  The patient is tolerating radiotherapy.   Plan:  Continue radiotherapy as planned. Weight loss - refer to nutritionist.  Lidocaine prescribed for sore esophagus if/ when it develops later on.  -----------------------------------  Eppie Gibson, MD

## 2015-11-15 ENCOUNTER — Ambulatory Visit
Admission: RE | Admit: 2015-11-15 | Discharge: 2015-11-15 | Disposition: A | Discharge status: Home / Self Care | Payer: BLUE CROSS/BLUE SHIELD | Source: Ambulatory Visit | Attending: Radiation Oncology | Admitting: Radiation Oncology

## 2015-11-15 DIAGNOSIS — Z510 Encounter for antineoplastic radiation therapy: Secondary | ICD-10-CM | POA: Diagnosis not present

## 2015-11-16 ENCOUNTER — Encounter: Payer: Self-pay | Admitting: Radiation Oncology

## 2015-11-16 ENCOUNTER — Ambulatory Visit
Admission: RE | Admit: 2015-11-16 | Discharge: 2015-11-16 | Disposition: A | Discharge status: Home / Self Care | Payer: BLUE CROSS/BLUE SHIELD | Source: Ambulatory Visit | Attending: Radiation Oncology | Admitting: Radiation Oncology

## 2015-11-16 ENCOUNTER — Telehealth: Payer: Self-pay | Admitting: *Deleted

## 2015-11-16 VITALS — BP 108/78 | HR 80 | Temp 97.6°F | Ht 62.0 in | Wt 119.2 lb

## 2015-11-16 DIAGNOSIS — C8520 Mediastinal (thymic) large B-cell lymphoma, unspecified site: Secondary | ICD-10-CM

## 2015-11-16 DIAGNOSIS — Z510 Encounter for antineoplastic radiation therapy: Secondary | ICD-10-CM | POA: Diagnosis not present

## 2015-11-16 NOTE — Telephone Encounter (Signed)
Called patient to inform of nutrition date and time, spoke with patient and she is aware of her date and time on 12-01-15 @ 11:15 am with Ernestene Kiel

## 2015-11-16 NOTE — Progress Notes (Signed)
   Weekly Management Note:  outpatient    ICD-9-CM ICD-10-CM   1. Mediastinal (thymic) large B-cell lymphoma, unspecified body region ([place]) 202.80 C85.20     Current Dose:  12.6 Gy  Projected Dose: 41.4 Gy   Narrative:  The patient presents for routine under treatment assessment.  CBCT/MVCT images/Port film x-rays were reviewed.  The chart was checked. Doing well, gained 2 lb. No new issues.  Physical Findings:  Wt Readings from Last 3 Encounters:  11/16/15 119 lb 3.2 oz (54.069 kg)  11/14/15 117 lb 4.8 oz (53.207 kg)  10/31/15 120 lb 1.6 oz (54.477 kg)    height is 5\' 2"  (1.575 m) and weight is 119 lb 3.2 oz (54.069 kg). Her temperature is 97.6 F (36.4 C). Her blood pressure is 108/78 and her pulse is 80. Her oxygen saturation is 100%.  NAD, well appearing. No skin irritation over anterior torso  CBC    Component Value Date/Time   WBC 2.4* 06/12/2015 0930   RBC 4.13 06/12/2015 0930   HGB 11.3* 06/12/2015 0930   HCT 34.7* 06/12/2015 0930   PLT 198 06/12/2015 0930   MCV 84.0 06/12/2015 0930   MCH 27.4 06/12/2015 0930   MCHC 32.6 06/12/2015 0930   RDW 16.7* 06/12/2015 0930   LYMPHSABS 1.0 04/22/2015 1353   MONOABS 0.3 04/22/2015 1353   EOSABS 0.1 04/22/2015 1353   BASOSABS 0.0 04/22/2015 1353     CMP     Component Value Date/Time   NA 138 04/22/2015 1353   K 4.1 04/22/2015 1353   CL 106 04/22/2015 1353   CO2 22 04/22/2015 1353   GLUCOSE 80 04/22/2015 1353   BUN 11.0 10/31/2015 0825   BUN 12 04/22/2015 1353   CREATININE 0.9 10/31/2015 0825   CREATININE 0.64 04/22/2015 1353   CALCIUM 9.2 04/22/2015 1353   PROT 8.1 04/22/2015 1353   ALBUMIN 4.2 04/22/2015 1353   AST 22 04/22/2015 1353   ALT 19 04/22/2015 1353   ALKPHOS 84 04/22/2015 1353   BILITOT 0.9 04/22/2015 1353   GFRNONAA >60 04/22/2015 1353   GFRAA >60 04/22/2015 1353     Impression:  The patient is tolerating radiotherapy.   Plan:  Continue radiotherapy as planned.     -----------------------------------  Eppie Gibson, MD

## 2015-11-16 NOTE — Progress Notes (Signed)
Katrina Aguilar is here for her 7th fraction of radiation to her Anterior Mediastinum. She denies any pain. She is eating fairlly, and her appetite is decreased. She has no other concerns at this time.   BP 108/78 mmHg  Pulse 80  Temp(Src) 97.6 F (36.4 C)  Ht 5\' 2"  (1.575 m)  Wt 119 lb 3.2 oz (54.069 kg)  BMI 21.80 kg/m2  SpO2 100%   Wt Readings from Last 3 Encounters:  11/16/15 119 lb 3.2 oz (54.069 kg)  11/14/15 117 lb 4.8 oz (53.207 kg)  10/31/15 120 lb 1.6 oz (54.477 kg)

## 2015-11-17 ENCOUNTER — Ambulatory Visit
Admission: RE | Admit: 2015-11-17 | Discharge: 2015-11-17 | Disposition: A | Discharge status: Home / Self Care | Payer: BLUE CROSS/BLUE SHIELD | Source: Ambulatory Visit | Attending: Radiation Oncology | Admitting: Radiation Oncology

## 2015-11-17 DIAGNOSIS — Z510 Encounter for antineoplastic radiation therapy: Secondary | ICD-10-CM | POA: Diagnosis not present

## 2015-11-18 ENCOUNTER — Ambulatory Visit
Admission: RE | Admit: 2015-11-18 | Discharge: 2015-11-18 | Disposition: A | Discharge status: Home / Self Care | Payer: BLUE CROSS/BLUE SHIELD | Source: Ambulatory Visit | Attending: Radiation Oncology | Admitting: Radiation Oncology

## 2015-11-18 DIAGNOSIS — C8520 Mediastinal (thymic) large B-cell lymphoma, unspecified site: Secondary | ICD-10-CM

## 2015-11-18 DIAGNOSIS — Z510 Encounter for antineoplastic radiation therapy: Secondary | ICD-10-CM | POA: Diagnosis not present

## 2015-11-18 NOTE — Progress Notes (Signed)
Ms. Drach came to nursing after treatment today complaining of acid reflux. She states food is getting stuck in her throat. She vomited food after eating yesterday. She is not sure what she can take to help her and has requested to see an MD.

## 2015-11-18 NOTE — Progress Notes (Signed)
   Department of Radiation Oncology  Phone:  [phone] Fax:        [phone]  Weekly Treatment Note    Name: Katrina Aguilar Date: 11/18/2015 MRN: [id number] DOB: 1985-08-19   Diagnosis:     ICD-9-CM ICD-10-CM   1. Mediastinal (thymic) large B-cell lymphoma, unspecified body region ([place]) 202.80 C85.20      Current dose: 16.4 Gy  Current fraction: 9   MEDICATIONS: Current Outpatient Prescriptions  Medication Sig Dispense Refill  . acetaminophen (TYLENOL) 325 MG tablet Take 325 mg by mouth every 6 (six) hours as needed.    Marland Kitchen ibuprofen (ADVIL,MOTRIN) 600 MG tablet Take 600 mg by mouth every 8 (eight) hours as needed.    . lidocaine (XYLOCAINE) 2 % solution Patient: Mix 1part 2% viscous lidocaine, 1part H20. Swallow 35mL of this mixture, 81min before meals and @bedtime , up to QID prn sore throat 100 mL 5  . oxyCODONE-acetaminophen (PERCOCET/ROXICET) 5-325 MG tablet Take by mouth. Reported on 11/14/2015    . prochlorperazine (COMPAZINE) 10 MG tablet Take 10 mg by mouth every 6 (six) hours as needed for nausea or vomiting.      No current facility-administered medications for this encounter.     ALLERGIES: Review of patient's allergies indicates no known allergies.   LABORATORY DATA:  Lab Results  Component Value Date   WBC 2.4* 06/12/2015   HGB 11.3* 06/12/2015   HCT 34.7* 06/12/2015   MCV 84.0 06/12/2015   PLT 198 06/12/2015   Lab Results  Component Value Date   NA 138 04/22/2015   K 4.1 04/22/2015   CL 106 04/22/2015   CO2 22 04/22/2015   Lab Results  Component Value Date   ALT 19 04/22/2015   AST 22 04/22/2015   ALKPHOS 84 04/22/2015   BILITOT 0.9 04/22/2015     NARRATIVE: Katrina Aguilar was seen today for weekly treatment management. The chart was checked and the patient's films were reviewed.  Ms. Mullenax came to nursing after treatment today complaining of acid reflux. She states food is getting stuck in her throat. She vomited food after  eating yesterday. She is not sure what she can take to help her and has requested to see an MD.     PHYSICAL EXAMINATION: vitals were not taken for this visit.     Alert, no acute distress  ASSESSMENT: The patient is doing satisfactorily with treatment.   The patient is complaining of esophagitis. She was given oral lidocaine for this but has not begun this yet. We discussed the purpose of this medication and she understands to begin this when she gets home.  PLAN: We will continue with the patient's radiation treatment as planned. As above, the patient will take liquid lidocaine. I have also recommended that she begin taking Prilosec. The patient is to let us know if this is not sufficient.

## 2015-11-22 ENCOUNTER — Ambulatory Visit
Admission: RE | Admit: 2015-11-22 | Discharge: 2015-11-22 | Disposition: A | Discharge status: Home / Self Care | Payer: BLUE CROSS/BLUE SHIELD | Source: Ambulatory Visit | Attending: Radiation Oncology | Admitting: Radiation Oncology

## 2015-11-22 DIAGNOSIS — Z510 Encounter for antineoplastic radiation therapy: Secondary | ICD-10-CM | POA: Diagnosis not present

## 2015-11-23 ENCOUNTER — Ambulatory Visit
Admission: RE | Admit: 2015-11-23 | Discharge: 2015-11-23 | Disposition: A | Discharge status: Home / Self Care | Payer: BLUE CROSS/BLUE SHIELD | Source: Ambulatory Visit | Attending: Radiation Oncology | Admitting: Radiation Oncology

## 2015-11-23 ENCOUNTER — Telehealth: Payer: Self-pay | Admitting: *Deleted

## 2015-11-23 ENCOUNTER — Telehealth: Payer: Self-pay | Admitting: Oncology

## 2015-11-23 ENCOUNTER — Encounter: Payer: Self-pay | Admitting: Radiation Oncology

## 2015-11-23 VITALS — BP 99/69 | HR 82 | Temp 97.8°F | Resp 16 | Ht 62.0 in | Wt 115.6 lb

## 2015-11-23 DIAGNOSIS — C8522 Mediastinal (thymic) large B-cell lymphoma, intrathoracic lymph nodes: Secondary | ICD-10-CM

## 2015-11-23 DIAGNOSIS — Z510 Encounter for antineoplastic radiation therapy: Secondary | ICD-10-CM | POA: Diagnosis not present

## 2015-11-23 MED ORDER — TRAMADOL HCL 50 MG PO TABS: 50.0000 mg | ORAL_TABLET | Freq: Four times a day (QID) | ORAL | Status: DC | PRN

## 2015-11-23 NOTE — Progress Notes (Signed)
Katrina Aguilar is here received 11 fractions to her anterior mediastinum.Marland Kitchen  No change to skin to the anterior mediastinum color normal.  Appetite is still decreased.  Food is still coming up in the back of her mouth at times with nausea and vomiting taking Zantac over the counter and Compazine.  Pain 5/10 chest taking Tylenol or Ibuprofen.  Having fatigue usually mid day. Using Lidocaine solution as needed. BP 99/69 mmHg  Pulse 82  Temp(Src) 97.8 F (36.6 C) (Oral)  Resp 16  Ht 5\' 2"  (1.575 m)  Wt 115 lb 9.6 oz (52.436 kg)  BMI 21.14 kg/m2  SpO2 98%  LMP 11/10/2015

## 2015-11-23 NOTE — Progress Notes (Signed)
  Radiation Oncology         [phone] ________________________________  Name: Katrina Aguilar MRN: [id number]  Date: 11/23/2015  DOB: Jun 02, 1986  Weekly Radiation Therapy Management    ICD-9-CM ICD-10-CM   1. 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 . . . . . . . The patient presents for routine under treatment assessment.                                  Ms. Menke is here received 11 fractions to her anterior mediastinum.Marland Kitchen No change to skin to the anterior mediastinum color normal. Her appetite is still poor. She reports food is hard to swallow at times with nausea and emesis. She is taking Zantac over the counter and Compazine. Pain is 5/10 to the chest and she is taking Tylenol or Ibuprofen.She has fatigue and it's usually in the middle of the day.. She is unable to tolerate the viscous lidocaine and gags when using this medication                                 Set-up films were reviewed.                                 The chart was checked. Physical Findings. . .  height is 5\' 2"  (1.575 m) and weight is 115 lb 9.6 oz (52.436 kg). Her oral temperature is 97.8 F (36.6 C). Her blood pressure is 99/69 and her pulse is 82. Her respiration is 16 and oxygen saturation is 98%. . She has lost 4lbs in the past week.  The lungs are clear. The heart has a regular rhythm and rate. Impression . . . . . . . The patient is tolerating radiation. Plan . . . . . . . . . . . . Continue treatment as planned. Patient is having significant esophageal symptoms. I reviewed her radiation fields and significant dose is covering the esophageal region. Patient is intolerant of the lidocaine solution and Tylenol and ibuprofen are not adequate to control her pain. In Light of this patient will be given a  prescription of tramadol.  ________________________________   Blair Promise,  PhD, MD   This document serves as a record of services personally performed by Gery Pray, MD. It was created on his behalf by Darcus Austin, a trained medical scribe. The creation of this record is based on the scribe's personal observations and the provider's statements to them. This document has been checked and approved by the attending provider.

## 2015-11-23 NOTE — Telephone Encounter (Signed)
Received a message asking for the quantity of tramadol tablets to be prescribed.  Called [facility] and left a message stating that the prescription is for 30 tablets, 0 refills.

## 2015-11-23 NOTE — Telephone Encounter (Signed)
On 11-23-15 fax medical records to disability determination services it was consult note, sim & planning note check with Dr. Sondra Come for the ok .

## 2015-11-24 ENCOUNTER — Ambulatory Visit
Admission: RE | Admit: 2015-11-24 | Discharge: 2015-11-24 | Disposition: A | Discharge status: Home / Self Care | Payer: BLUE CROSS/BLUE SHIELD | Source: Ambulatory Visit | Attending: Radiation Oncology | Admitting: Radiation Oncology

## 2015-11-24 DIAGNOSIS — Z510 Encounter for antineoplastic radiation therapy: Secondary | ICD-10-CM | POA: Diagnosis not present

## 2015-11-25 ENCOUNTER — Ambulatory Visit
Admission: RE | Admit: 2015-11-25 | Discharge: 2015-11-25 | Disposition: A | Discharge status: Home / Self Care | Payer: BLUE CROSS/BLUE SHIELD | Source: Ambulatory Visit | Attending: Radiation Oncology | Admitting: Radiation Oncology

## 2015-11-25 DIAGNOSIS — Z510 Encounter for antineoplastic radiation therapy: Secondary | ICD-10-CM | POA: Diagnosis not present

## 2015-11-28 ENCOUNTER — Encounter: Payer: Self-pay | Admitting: Radiation Oncology

## 2015-11-28 ENCOUNTER — Ambulatory Visit
Admission: RE | Admit: 2015-11-28 | Discharge: 2015-11-28 | Disposition: A | Discharge status: Home / Self Care | Payer: BLUE CROSS/BLUE SHIELD | Source: Ambulatory Visit | Attending: Radiation Oncology | Admitting: Radiation Oncology

## 2015-11-28 VITALS — BP 108/68 | HR 87 | Temp 98.0°F | Ht 62.0 in | Wt 116.0 lb

## 2015-11-28 DIAGNOSIS — C8520 Mediastinal (thymic) large B-cell lymphoma, unspecified site: Secondary | ICD-10-CM

## 2015-11-28 DIAGNOSIS — Z510 Encounter for antineoplastic radiation therapy: Secondary | ICD-10-CM | POA: Diagnosis not present

## 2015-11-28 MED ORDER — SUCRALFATE 1 G PO TABS: ORAL_TABLET | ORAL | Status: DC

## 2015-11-28 NOTE — Progress Notes (Signed)
   Weekly Management Note:  outpatient    ICD-9-CM ICD-10-CM   1. Mediastinal (thymic) large B-cell lymphoma, unspecified body region ([place]) 202.80 C85.20 sucralfate (CARAFATE) 1 g tablet    Current Dose:  25.2 Gy  Projected Dose: 41.4 Gy   Narrative:  The patient presents for routine under treatment assessment.  CBCT/MVCT images/Port film x-rays were reviewed.  The chart was checked.  Katrina Aguilar is here for her 14th fraction of radiation to her Anterior Mediastinum. She reports some improvement in her acid reflux since last week. Pain is in esophagus, but stomach will "gurgle" before reflux.  She is taking zantac daily and has tried ultram about once every other day since prescribed. She still has occasionally nausea with eating or before she eats. She is taking compazine as she needs it. She is not eating as well as she should she states, if she has not had a good breakfast she has been drinking a Boost in the morning. She reports tenderness to her radiation site, but has some slight hyperpigmentation. She has not started the radiaplex cream, but will start using it today. She does have some fatigue with up and down peaks during the day, and will try to rest with her moms help to watch her children.   Physical Findings:  Wt Readings from Last 3 Encounters:  11/28/15 116 lb (52.617 kg)  11/23/15 115 lb 9.6 oz (52.436 kg)  11/16/15 119 lb 3.2 oz (54.069 kg)    height is 5\' 2"  (1.575 m) and weight is 116 lb (52.617 kg). Her temperature is 98 F (36.7 C). Her blood pressure is 108/68 and her pulse is 87. Her oxygen saturation is 100%.  NAD, well appearing. Mild hyperpigmentation over anterior torso  CBC    Component Value Date/Time   WBC 2.4* 06/12/2015 0930   RBC 4.13 06/12/2015 0930   HGB 11.3* 06/12/2015 0930   HCT 34.7* 06/12/2015 0930   PLT 198 06/12/2015 0930   MCV 84.0 06/12/2015 0930   MCH 27.4 06/12/2015 0930   MCHC 32.6 06/12/2015 0930   RDW 16.7* 06/12/2015 0930   LYMPHSABS  1.0 04/22/2015 1353   MONOABS 0.3 04/22/2015 1353   EOSABS 0.1 04/22/2015 1353   BASOSABS 0.0 04/22/2015 1353     CMP     Component Value Date/Time   NA 138 04/22/2015 1353   K 4.1 04/22/2015 1353   CL 106 04/22/2015 1353   CO2 22 04/22/2015 1353   GLUCOSE 80 04/22/2015 1353   BUN 11.0 10/31/2015 0825   BUN 12 04/22/2015 1353   CREATININE 0.9 10/31/2015 0825   CREATININE 0.64 04/22/2015 1353   CALCIUM 9.2 04/22/2015 1353   PROT 8.1 04/22/2015 1353   ALBUMIN 4.2 04/22/2015 1353   AST 22 04/22/2015 1353   ALT 19 04/22/2015 1353   ALKPHOS 84 04/22/2015 1353   BILITOT 0.9 04/22/2015 1353   GFRNONAA >60 04/22/2015 1353   GFRAA >60 04/22/2015 1353     Impression:  The patient is tolerating radiotherapy with esophagitis.   Plan:  Continue radiotherapy as planned.  Zantac is helping.  Rx carafate today as well. Lidocaine hard to tolerate.   -----------------------------------  Eppie Gibson, MD

## 2015-11-28 NOTE — Progress Notes (Signed)
Ms. Ketcham is here for her 14th fraction of radiation to her Anterior Mediastinum. She reports some improvement in her acid reflux since last week. She is taking zantac daily and has tried ultram about once every other day since prescribed. She still has occasionally nausea with eating or before she eats. She is taking compazine as she needs it. She is not eating as well as she should she states, if she has not had a good breakfast she has been drinking a Boost in the morning. She reports tenderness to her radiation site, but has some slight hyperpigmentation. She has not started the radiaplex cream, but will start using it today. She does have some fatigue with up and down peaks during the day, and will try to rest with her moms help to watch her children.  BP 108/68 mmHg  Pulse 87  Temp(Src) 98 F (36.7 C)  Ht 5\' 2"  (1.575 m)  Wt 116 lb (52.617 kg)  BMI 21.21 kg/m2  SpO2 100%  LMP 11/10/2015   Wt Readings from Last 3 Encounters:  11/28/15 116 lb (52.617 kg)  11/23/15 115 lb 9.6 oz (52.436 kg)  11/16/15 119 lb 3.2 oz (54.069 kg)

## 2015-11-29 ENCOUNTER — Ambulatory Visit
Admission: RE | Admit: 2015-11-29 | Discharge: 2015-11-29 | Disposition: A | Discharge status: Home / Self Care | Payer: BLUE CROSS/BLUE SHIELD | Source: Ambulatory Visit | Attending: Radiation Oncology | Admitting: Radiation Oncology

## 2015-11-29 DIAGNOSIS — Z510 Encounter for antineoplastic radiation therapy: Secondary | ICD-10-CM | POA: Diagnosis not present

## 2015-11-30 ENCOUNTER — Ambulatory Visit
Admission: RE | Admit: 2015-11-30 | Discharge: 2015-11-30 | Disposition: A | Discharge status: Home / Self Care | Payer: BLUE CROSS/BLUE SHIELD | Source: Ambulatory Visit | Attending: Radiation Oncology | Admitting: Radiation Oncology

## 2015-11-30 DIAGNOSIS — Z510 Encounter for antineoplastic radiation therapy: Secondary | ICD-10-CM | POA: Diagnosis not present

## 2015-12-01 ENCOUNTER — Ambulatory Visit: Payer: BLUE CROSS/BLUE SHIELD | Admitting: Nutrition

## 2015-12-01 ENCOUNTER — Ambulatory Visit
Admission: RE | Admit: 2015-12-01 | Discharge: 2015-12-01 | Disposition: A | Discharge status: Home / Self Care | Payer: BLUE CROSS/BLUE SHIELD | Source: Ambulatory Visit | Attending: Radiation Oncology | Admitting: Radiation Oncology

## 2015-12-01 DIAGNOSIS — Z510 Encounter for antineoplastic radiation therapy: Secondary | ICD-10-CM | POA: Diagnosis not present

## 2015-12-01 NOTE — Progress Notes (Signed)
30 year old female diagnosed with large B-cell lymphoma.  She is a patient of Dr. Isidore Moos receiving radiation therapy.  Past medical history includes chronic pain and surgical wound dehiscence.  Medications include Compazine and lidocaine.  Labs were reviewed.  Height: 62 inches. Weight: 116 pounds June 8. Usual body weight: 133 pounds in December 2016. BMI: 21.21.  Patient reports poor appetite and some difficulty swallowing. She reports positive nausea even with nausea medications. She states she has some reflux. She will drink Ensure Plus, one bottle daily. States she would like to regain some weight.  Nutrition diagnosis: Unintended weight loss related to diagnosis of large B-cell lymphoma and associated treatments as evidenced by 13% weight loss over 6 months.  Malnutrition related to 13% weight loss over 6 months and less than 75% energy intake for greater than one month.  Intervention: Educated patient to consume 6 small meals/snacks daily consuming high-calorie, high-protein foods. Reviewed fact sheets to include increasing calories and protein, and soft, moist protein foods. Encouraged patient to comply with nausea medications and discuss nausea with M.D. if this is not improved. Educated patient on foods to eat and foods to avoid with nausea and vomiting.  Provided fact sheets. Recommended patient continue oral nutrition supplements 1-2 daily and provided samples. Questions were answered.  Teach back method used.  Contact information provided.  Monitoring, evaluation, goals: Patient will tolerate adequate calories and protein to promote gain of lean body mass.  Next visit: Patient will contact me for questions or concerns.  **Disclaimer: This note was dictated with voice recognition software. Similar sounding words can inadvertently be transcribed and this note may contain transcription errors which may not have been corrected upon publication of note.**

## 2015-12-02 ENCOUNTER — Ambulatory Visit
Admission: RE | Admit: 2015-12-02 | Discharge: 2015-12-02 | Disposition: A | Discharge status: Home / Self Care | Payer: BLUE CROSS/BLUE SHIELD | Source: Ambulatory Visit | Attending: Radiation Oncology | Admitting: Radiation Oncology

## 2015-12-02 DIAGNOSIS — Z510 Encounter for antineoplastic radiation therapy: Secondary | ICD-10-CM | POA: Diagnosis not present

## 2015-12-05 ENCOUNTER — Ambulatory Visit
Admission: RE | Admit: 2015-12-05 | Discharge: 2015-12-05 | Disposition: A | Discharge status: Home / Self Care | Payer: BLUE CROSS/BLUE SHIELD | Source: Ambulatory Visit | Attending: Radiation Oncology | Admitting: Radiation Oncology

## 2015-12-05 VITALS — BP 107/65 | HR 83 | Temp 98.2°F | Ht 62.0 in | Wt 114.7 lb

## 2015-12-05 DIAGNOSIS — C8520 Mediastinal (thymic) large B-cell lymphoma, unspecified site: Secondary | ICD-10-CM

## 2015-12-05 DIAGNOSIS — Z510 Encounter for antineoplastic radiation therapy: Secondary | ICD-10-CM | POA: Diagnosis not present

## 2015-12-05 MED ORDER — DRONABINOL 2.5 MG PO CAPS: ORAL_CAPSULE | ORAL | Status: DC

## 2015-12-05 MED ORDER — PROMETHAZINE HCL 25 MG PO TABS: 25.0000 mg | ORAL_TABLET | Freq: Four times a day (QID) | ORAL | Status: DC | PRN

## 2015-12-05 MED ORDER — TRAMADOL HCL 50 MG PO TABS: 50.0000 mg | ORAL_TABLET | Freq: Four times a day (QID) | ORAL | Status: DC | PRN

## 2015-12-05 NOTE — Progress Notes (Signed)
   Weekly Management Note:  outpatient    ICD-9-CM ICD-10-CM   1. Mediastinal (thymic) large B-cell lymphoma, unspecified body region ([facility]) 202.80 C85.20 promethazine (PHENERGAN) 25 MG tablet     traMADol (ULTRAM) 50 MG tablet     dronabinol (MARINOL) 2.5 MG capsule    Current Dose:  34.2 Gy  Projected Dose: 41.4 Gy   Narrative:  The patient presents for routine under treatment assessment.  CBCT/MVCT images/Port film x-rays were reviewed.  The chart was checked.  She denies pain at this time, but does have mid chest pain, and is taking ultram twice daily with relief. She has a cough today with whitish sputum, and is frequently clearing her throat. She has carried water with her over the last few days because of a dry throat. She has some nausea at different times during the day. She has tried Compazine, but it does not always work for her. She reports that marijuana helped nausea when she took chemotherapy.  She is not eating well. She is eating about 2 meals a day with snacks in between. She is drinking water and juice. She is drinking 1 Boost daily. She did see the Nutritionist this past Thursday and was given some suggestions, and was given the Boost.  She does have fatigue, and will rest when she is able.   Physical Findings:  Wt Readings from Last 3 Encounters:  12/05/15 114 lb 11.2 oz (52.028 kg)  11/28/15 116 lb (52.617 kg)  11/23/15 115 lb 9.6 oz (52.436 kg)    height is 5\' 2"  (1.575 m) and weight is 114 lb 11.2 oz (52.028 kg). Her temperature is 98.2 F (36.8 C). Her blood pressure is 107/65 and her pulse is 83. Her oxygen saturation is 97%.  NAD, well appearing. Mild hyperpigmentation over anterior torso, skin intact  CBC    Component Value Date/Time   WBC 2.4* 06/12/2015 0930   RBC 4.13 06/12/2015 0930   HGB 11.3* 06/12/2015 0930   HCT 34.7* 06/12/2015 0930   PLT 198 06/12/2015 0930   MCV 84.0 06/12/2015 0930   MCH 27.4 06/12/2015 0930   MCHC 32.6 06/12/2015 0930   RDW  16.7* 06/12/2015 0930   LYMPHSABS 1.0 04/22/2015 1353   MONOABS 0.3 04/22/2015 1353   EOSABS 0.1 04/22/2015 1353   BASOSABS 0.0 04/22/2015 1353     CMP     Component Value Date/Time   NA 138 04/22/2015 1353   K 4.1 04/22/2015 1353   CL 106 04/22/2015 1353   CO2 22 04/22/2015 1353   GLUCOSE 80 04/22/2015 1353   BUN 11.0 10/31/2015 0825   BUN 12 04/22/2015 1353   CREATININE 0.9 10/31/2015 0825   CREATININE 0.64 04/22/2015 1353   CALCIUM 9.2 04/22/2015 1353   PROT 8.1 04/22/2015 1353   ALBUMIN 4.2 04/22/2015 1353   AST 22 04/22/2015 1353   ALT 19 04/22/2015 1353   ALKPHOS 84 04/22/2015 1353   BILITOT 0.9 04/22/2015 1353   GFRNONAA >60 04/22/2015 1353   GFRAA >60 04/22/2015 1353     Impression:  The patient is tolerating radiotherapy with esophagitis.   Plan:  Continue radiotherapy as planned.  Phenergan for nausea in lieu of compazine. Zofran too expensive per patient.  Marinol Rx for appetite. Tramadol refill. -----------------------------------  Eppie Gibson, MD

## 2015-12-05 NOTE — Progress Notes (Signed)
Katrina Aguilar is here for her 19th fraction of radiation to her Anterior Mediastinum. She denies pain at this time, but does have mid chest pain, and is taking ultram twice daily with relief. She has a cough today with whitish sputum, and is frequently clearing her throat. She has carried water with her over the last few days because of a dry throat. She has some nausea at different times during the day. She has tried Compazine, but it does not always work for her. She is not eating well. She is eating about 2 meals a day with snacks in between. She is drinking water and juice. She is drinking 1 Boost daily. She did see the Nutritionist this past Thursday and was given some suggestions, and was given the Boost. The skin over her radiation site is intact, and she is using Radiaplex cream twice daily. She does have fatigue, and will rest when she is able. She is also trying to exercise when her energy level peaks.   BP 107/65 mmHg  Pulse 83  Temp(Src) 98.2 F (36.8 C)  Ht 5\' 2"  (1.575 m)  Wt 114 lb 11.2 oz (52.028 kg)  BMI 20.97 kg/m2  SpO2 97%  LMP 11/10/2015   Wt Readings from Last 3 Encounters:  12/05/15 114 lb 11.2 oz (52.028 kg)  11/28/15 116 lb (52.617 kg)  11/23/15 115 lb 9.6 oz (52.436 kg)

## 2015-12-06 ENCOUNTER — Ambulatory Visit
Admission: RE | Admit: 2015-12-06 | Discharge: 2015-12-06 | Disposition: A | Discharge status: Home / Self Care | Payer: BLUE CROSS/BLUE SHIELD | Source: Ambulatory Visit | Attending: Radiation Oncology | Admitting: Radiation Oncology

## 2015-12-06 DIAGNOSIS — Z510 Encounter for antineoplastic radiation therapy: Secondary | ICD-10-CM | POA: Diagnosis not present

## 2015-12-07 ENCOUNTER — Ambulatory Visit
Admission: RE | Admit: 2015-12-07 | Discharge: 2015-12-07 | Disposition: A | Discharge status: Home / Self Care | Payer: BLUE CROSS/BLUE SHIELD | Source: Ambulatory Visit | Attending: Radiation Oncology | Admitting: Radiation Oncology

## 2015-12-07 DIAGNOSIS — Z510 Encounter for antineoplastic radiation therapy: Secondary | ICD-10-CM | POA: Diagnosis not present

## 2015-12-08 ENCOUNTER — Ambulatory Visit
Admission: RE | Admit: 2015-12-08 | Discharge: 2015-12-08 | Disposition: A | Discharge status: Home / Self Care | Payer: BLUE CROSS/BLUE SHIELD | Source: Ambulatory Visit | Attending: Radiation Oncology | Admitting: Radiation Oncology

## 2015-12-08 DIAGNOSIS — Z510 Encounter for antineoplastic radiation therapy: Secondary | ICD-10-CM | POA: Diagnosis not present

## 2015-12-09 ENCOUNTER — Ambulatory Visit
Admission: RE | Admit: 2015-12-09 | Discharge: 2015-12-09 | Disposition: A | Discharge status: Home / Self Care | Payer: BLUE CROSS/BLUE SHIELD | Source: Ambulatory Visit | Attending: Radiation Oncology | Admitting: Radiation Oncology

## 2015-12-09 ENCOUNTER — Encounter: Payer: Self-pay | Admitting: Radiation Oncology

## 2015-12-09 VITALS — BP 102/70 | HR 91 | Temp 98.3°F | Resp 18 | Ht 62.0 in | Wt 115.6 lb

## 2015-12-09 DIAGNOSIS — C8520 Mediastinal (thymic) large B-cell lymphoma, unspecified site: Secondary | ICD-10-CM

## 2015-12-09 DIAGNOSIS — Z510 Encounter for antineoplastic radiation therapy: Secondary | ICD-10-CM | POA: Diagnosis not present

## 2015-12-09 NOTE — Progress Notes (Signed)
Katrina Aguilar is here for her 53 fraction of radiation to her Anterior Mediastinum. She has pain 3/10 to her mid , and is taking ultram twice daily with relief. She has a cough at times with clear sputum, and is frequently clearing her throat. She has carried water with her over the last few days because of a dry throat. She has some nausea at different times during the day. She has tried Compazine, but it does not always work for her. The Phenergan seems to calm the nausea. She is  Improving with her eating. She is eating about 3 meals a day with snacks in between. She is drinking water and juice. She is drinking 1 Boost daily. BP 102/70 mmHg  Pulse 91  Temp(Src) 98.3 F (36.8 C) (Oral)  Resp 18  Ht 5\' 2"  (1.575 m)  Wt 115 lb 9.6 oz (52.436 kg)  BMI 21.14 kg/m2  SpO2 100%  LMP 12/05/2015

## 2015-12-09 NOTE — Progress Notes (Signed)
   Weekly Management Note:  outpatient    ICD-9-CM ICD-10-CM   1. Mediastinal (thymic) large B-cell lymphoma, unspecified body region ([place]) 202.80 C85.20     Current Dose:  41.4 Gy  Projected Dose: 41.4 Gy   Narrative:   Katrina Aguilar is here for her final 23rd fraction of radiation to her Anterior Mediastinum. She has pain 3/10 to her mid , and is taking ultram twice daily with relief. She has a cough at times with clear sputum, and is frequently clearing her throat. She has carried water with her over the last few days because of a dry throat. She has some nausea at different times during the day. She has tried Compazine, but it does not always work for her. The Phenergan seems to calm the nausea. She is Improving with her eating. She is eating about 3 meals a day with snacks in between. She is drinking water and juice. She is drinking 1 Boost daily.   Physical Findings:  Wt Readings from Last 3 Encounters:  12/09/15 115 lb 9.6 oz (52.436 kg)  12/05/15 114 lb 11.2 oz (52.028 kg)  11/28/15 116 lb (52.617 kg)    height is 5\' 2"  (1.575 m) and weight is 115 lb 9.6 oz (52.436 kg). Her oral temperature is 98.3 F (36.8 C). Her blood pressure is 102/70 and her pulse is 91. Her respiration is 18 and oxygen saturation is 100%.  NAD, well appearing. Mild hyperpigmentation over mid line of back and chest in the treatment fields; skin intact   CBC    Component Value Date/Time   WBC 2.4* 06/12/2015 0930   RBC 4.13 06/12/2015 0930   HGB 11.3* 06/12/2015 0930   HCT 34.7* 06/12/2015 0930   PLT 198 06/12/2015 0930   MCV 84.0 06/12/2015 0930   MCH 27.4 06/12/2015 0930   MCHC 32.6 06/12/2015 0930   RDW 16.7* 06/12/2015 0930   LYMPHSABS 1.0 04/22/2015 1353   MONOABS 0.3 04/22/2015 1353   EOSABS 0.1 04/22/2015 1353   BASOSABS 0.0 04/22/2015 1353     CMP     Component Value Date/Time   NA 138 04/22/2015 1353   K 4.1 04/22/2015 1353   CL 106 04/22/2015 1353   CO2 22 04/22/2015 1353   GLUCOSE 80 04/22/2015 1353   BUN 11.0 10/31/2015 0825   BUN 12 04/22/2015 1353   CREATININE 0.9 10/31/2015 0825   CREATININE 0.64 04/22/2015 1353   CALCIUM 9.2 04/22/2015 1353   PROT 8.1 04/22/2015 1353   ALBUMIN 4.2 04/22/2015 1353   AST 22 04/22/2015 1353   ALT 19 04/22/2015 1353   ALKPHOS 84 04/22/2015 1353   BILITOT 0.9 04/22/2015 1353   GFRNONAA >60 04/22/2015 1353   GFRAA >60 04/22/2015 1353     Impression: The patient has completed radiation treatment today. Recommend radiaplex over treatment area for continued healing. Transition to Vitamin E lotion in about a month. She feels she is eating well and nausea is controlled by phenergan.   Plan:  She will return for 1 month follow up.   -----------------------------------  Eppie Gibson, MD  This document serves as a record of services personally performed by Eppie Gibson, MD. It was created on her behalf by Derek Mound, a trained medical scribe. The creation of this record is based on the scribe's personal observations and the provider's statements to them. This document has been checked and approved by the attending provider.

## 2015-12-12 ENCOUNTER — Ambulatory Visit: Payer: BLUE CROSS/BLUE SHIELD

## 2015-12-14 NOTE — Progress Notes (Signed)
  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     Plan: The patient has completed radiation treatment. The patient will return to radiation oncology clinic for routine followup in one month. I advised them to call or return sooner if they have any questions or concerns related to their recovery or treatment.  -----------------------------------  Eppie Gibson, MD  This document serves as a record of services personally performed by Eppie Gibson, MD. It was created on her behalf by Derek Mound, a trained medical scribe. The creation of this record is based on the scribe's personal observations and the provider's statements to them. This document has been checked and approved by the attending provider.

## 2015-12-28 ENCOUNTER — Telehealth: Payer: Self-pay

## 2015-12-28 NOTE — Telephone Encounter (Signed)
error 

## 2015-12-28 NOTE — Telephone Encounter (Signed)
Katrina Aguilar called today concerned because she has a tooth infection. She went to [facility], and they referred her to an Chief Financial Officer because of her recent treatment with Radiation and Chemotherapy. She reports [facility] did not prescribe her an antibiotic and her appointment with the oral surgeon is not till next week. She inquired if Dr. Isidore Moos would be able to prescribe something for her. I have communicated with Dr. Isidore Moos and she recommends that [name] visit an Urgent Care because she does not have a PCP. I have contacted Ms. Marcantel with the recommendations, and she verbalized her understanding. She knows to call me if she has any other questions or concerns.

## 2015-12-29 ENCOUNTER — Ambulatory Visit (INDEPENDENT_AMBULATORY_CARE_PROVIDER_SITE_OTHER): Payer: BLUE CROSS/BLUE SHIELD | Admitting: Physician Assistant

## 2015-12-29 VITALS — BP 116/80 | HR 82 | Temp 98.1°F | Resp 18 | Ht 63.0 in | Wt 114.0 lb

## 2015-12-29 DIAGNOSIS — K0889 Other specified disorders of teeth and supporting structures: Secondary | ICD-10-CM | POA: Diagnosis not present

## 2015-12-29 DIAGNOSIS — K029 Dental caries, unspecified: Secondary | ICD-10-CM | POA: Diagnosis not present

## 2015-12-29 MED ORDER — HYDROCODONE-ACETAMINOPHEN 5-325 MG PO TABS: 1.0000 | ORAL_TABLET | Freq: Four times a day (QID) | ORAL | Status: DC | PRN

## 2015-12-29 MED ORDER — AMOXICILLIN 500 MG PO CAPS: 500.0000 mg | ORAL_CAPSULE | Freq: Three times a day (TID) | ORAL | Status: DC

## 2015-12-29 NOTE — Progress Notes (Signed)
Katrina Aguilar  MRN: [id number] DOB: May 28, 1986  Subjective:  Pt presents to clinic with tooth pain for the last week but over the last 2 days the pain has worsened and now her right lower jaw is swollen - she has extraction scheduled for 7/11 for tooth #30.  She saw the dentist on 7/3 but she was not given any medications.    Chemo finished 3/22 at [facility].  Last radiation treatment was 6/16. Last CBC was normal in March.  Next appt with oncologist is 7/28.   Patient Active Problem List   Diagnosis Date Noted  . 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 Chronic pain associated with significant psychosocial dysfunction 10/10/2015  . Pars defect 10/10/2015    Current Outpatient Prescriptions on File Prior to Visit  Medication Sig Dispense Refill  . dronabinol (MARINOL) 2.5 MG capsule Start taking 1 capsule BID, 1 hr before lunch and 1 hr before dinner.  After a few days, if needed, increase dinner dose to 2 capsules.  After a few more days, if needed, also increase lunch dose to 2 capsules. 110 capsule 0  . ibuprofen (ADVIL,MOTRIN) 600 MG tablet Take 600 mg by mouth every 8 (eight) hours as needed.    . prochlorperazine (COMPAZINE) 10 MG tablet Take 10 mg by mouth every 6 (six) hours as needed for nausea or vomiting.     . traMADol (ULTRAM) 50 MG tablet Take 1 tablet (50 mg total) by mouth every 6 (six) hours as needed for moderate pain. 75 tablet 0  . lidocaine (XYLOCAINE) 2 % solution Patient: Mix 1part 2% viscous lidocaine, 1part H20. Swallow 62mL of this mixture, 77min before meals and @bedtime , up to QID prn sore throat (Patient not taking: Reported on 12/09/2015) 100 mL 5  . promethazine (PHENERGAN) 25 MG tablet Take 1 tablet (25 mg total) by mouth every 6 (six) hours as needed for nausea or vomiting. (Patient not taking: Reported on 12/29/2015) 30 tablet 3   No current facility-administered medications on file prior to visit.    No Known Allergies  Review of  Systems  Constitutional: Negative for fever and chills.  HENT: Positive for facial swelling.    Objective:  BP 116/80 mmHg  Pulse 82  Temp(Src) 98.1 F (36.7 C) (Oral)  Resp 18  Ht 5\' 3"  (1.6 m)  Wt 114 lb (51.71 kg)  BMI 20.20 kg/m2  SpO2 97%  LMP 12/05/2015  Physical Exam  Constitutional: She is oriented to person, place, and time and well-developed, well-nourished, and in no distress.  HENT:  Head: Normocephalic and atraumatic.    Right Ear: Hearing and external ear normal.  Left Ear: Hearing and external ear normal.  Mouth/Throat:    Eyes: Conjunctivae are normal.  Neck: Normal range of motion.  Cardiovascular: Normal rate, regular rhythm and normal heart sounds.   No murmur heard. Pulmonary/Chest: Effort normal and breath sounds normal. She has no wheezes.  Neurological: She is alert and oriented to person, place, and time. Gait normal.  Skin: Skin is warm and dry.  Psychiatric: Mood, memory, affect and judgment normal.  Vitals reviewed.   Assessment and Plan :  Tooth pain - Plan: amoxicillin (AMOXIL) 500 MG capsule, HYDROcodone-acetaminophen (NORCO/VICODIN) 5-325 MG tablet  Dental caries   Pt to rinse out mouth and f/u with dentist as scheduled.  Take all of abx.  She will add motrin 600mg  that she has at home to augment her pain medication.  Windell Hummingbird PA-C  [facility] 12/29/2015 11:29 AM

## 2015-12-29 NOTE — Patient Instructions (Addendum)
Ice packs Rinse mouth Soft food    IF you received an x-ray today, you will receive an invoice from [facility]. Please contact [facility] at [phone] with questions or concerns regarding your invoice.   IF you received labwork today, you will receive an invoice from [employer]. Please contact [facility] at [phone] with questions or concerns regarding your invoice.   Our billing staff will not be able to assist you with questions regarding bills from these companies.  You will be contacted with the lab results as soon as they are available. The fastest way to get your results is to activate your My Chart account. Instructions are located on the last page of this paperwork. If you have not heard from [place] regarding the results in 2 weeks, please contact this office.

## 2016-01-11 ENCOUNTER — Encounter: Payer: Self-pay | Admitting: Radiation Oncology

## 2016-01-20 ENCOUNTER — Ambulatory Visit
Admission: RE | Admit: 2016-01-20 | Discharge: 2016-01-20 | Disposition: A | Discharge status: Home / Self Care | Payer: BLUE CROSS/BLUE SHIELD | Source: Ambulatory Visit | Attending: Radiation Oncology | Admitting: Radiation Oncology

## 2016-01-20 ENCOUNTER — Ambulatory Visit: Payer: Self-pay | Admitting: Radiation Oncology

## 2016-01-20 ENCOUNTER — Encounter: Payer: Self-pay | Admitting: Radiation Oncology

## 2016-01-20 DIAGNOSIS — R634 Abnormal weight loss: Secondary | ICD-10-CM | POA: Diagnosis not present

## 2016-01-20 DIAGNOSIS — C8520 Mediastinal (thymic) large B-cell lymphoma, unspecified site: Secondary | ICD-10-CM | POA: Diagnosis not present

## 2016-01-20 DIAGNOSIS — F419 Anxiety disorder, unspecified: Secondary | ICD-10-CM | POA: Diagnosis not present

## 2016-01-20 DIAGNOSIS — C8522 Mediastinal (thymic) large B-cell lymphoma, intrathoracic lymph nodes: Secondary | ICD-10-CM

## 2016-01-20 HISTORY — DX: Personal history of irradiation: Z92.3

## 2016-01-20 NOTE — Progress Notes (Signed)
Radiation Oncology         [phone] ________________________________  Name: Katrina Aguilar MRN: [id number]  Date: 01/20/2016  DOB: 1985-09-10  Follow-Up Visit Note  Outpatient  CC: No PCP Per Patient  No ref. provider found  Diagnosis and Prior Radiotherapy:    ICD-9-CM ICD-10-CM   1. 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 She denies pain at this time. She reports some fatigue and depression related to her home situation caring for her nephew. The skin to her mid back has some hyperpigmentation and mild hyperpigmentation to her anterior mid chest. She continues to use the radiaplex to these areas and will switch to a Vitamin E cream when finished. She reports a decreased appetite despite using marinol which she reports makes her hungry, but she still has a decreased desire to eat. She reports that she is worried to eat foods that might cause cancer. She has not followed up with her medical oncologist at [facility] and has requested a referral to see someone here in [place].  ALLERGIES:  has No Known Allergies.  Meds: Current Outpatient Prescriptions  Medication Sig Dispense Refill  . dronabinol (MARINOL) 2.5 MG capsule Start taking 1 capsule BID, 1 hr before lunch and 1 hr before dinner.  After a few days, if needed, increase dinner dose to 2 capsules.  After a few more days, if needed, also increase lunch dose to 2 capsules. 110 capsule 0  . ibuprofen (ADVIL,MOTRIN) 600 MG tablet Take 600 mg by mouth every 8 (eight) hours as needed.    .  prochlorperazine (COMPAZINE) 10 MG tablet Take 10 mg by mouth every 6 (six) hours as needed for nausea or vomiting.     . traMADol (ULTRAM) 50 MG tablet Take 1 tablet (50 mg total) by mouth every 6 (six) hours as needed for moderate pain. 75 tablet 0  . lidocaine (XYLOCAINE) 2 % solution Patient: Mix 1part 2% viscous lidocaine, 1part H20. Swallow 16mL of this mixture, 28min before meals and @bedtime , up to QID prn sore throat (Patient not taking: Reported on 12/09/2015) 100 mL 5  . promethazine (PHENERGAN) 25 MG tablet Take 1 tablet (25 mg total) by mouth every 6 (six) hours as needed for nausea or vomiting. (Patient not taking: Reported on 12/29/2015) 30 tablet 3   No current facility-administered medications for this encounter.     Physical Findings:  height is 5\' 3"  (1.6 m) and weight is 110 lb 6.4 oz (50.1 kg). Her temperature is 98.6 F (37 C). Her blood pressure is 101/63 and her pulse is 100. Her oxygen saturation is 100%.   Wt Readings from Last 3 Encounters:  01/20/16 110 lb 6.4 oz (50.1 kg)  12/29/15 114 lb (51.7 kg)  12/09/15 115 lb 9.6 oz (52.4 kg)   The patient was tearful during the encounter. Some residual hyperpigmentation over the center of her upper back.  Lab Findings: Lab Results  Component Value Date   WBC 2.4 (L) 06/12/2015   HGB 11.3 (L) 06/12/2015   HCT 34.7 (L) 06/12/2015   MCV 84.0  06/12/2015   PLT 198 06/12/2015    Radiographic Findings: No results found.  Impression/Plan: We discussed the importance of healthy nutrition; such as the implementation of eating whole grains, proteins, healthy fats, etc.. But most importantly, her body need nourishment and calories.  Reassurance given that eating will not cause her lymphoma to return. We will refer her back to nutrition due to weight loss, loss of appetite, and the anxiety of foods causing cancer.  I advised her to apply vitamin E lotion to the treatment area and to follow proper sun hygiene.   We will refer her  back to social work regarding social stressors and feelings of depression, situational. We talked about self-care which is a struggle for her.  She has a lot of responsibility at home.  We will referto med/onc for lymphoma surveillance since she no longer follows with her med/onc at [facility]. Will order a PET restgaing scan to precede that appt. I've ordered for her prior imaging to be digitized in our system.  I gave the patient a card to follow up with me in 6 months.   _____________________________________   Eppie [place], MD  This document serves as a record of services personally performed by Eppie [place], MD. It was created on her behalf by Darcus Austin, a trained medical scribe. The creation of this record is based on the scribe's personal observations and the provider's statements to them. This document has been checked and approved by the attending provider.

## 2016-01-20 NOTE — Progress Notes (Signed)
[name] presents for follow up of radiation completed 12/09/15 to her Anterior Mediastinum. She denies pain at this time. She reports some fatigue and depression related to her home situation caring for her nephew. The skin to her mid back has some hyperpigmentation and mild hyperpigmentation to her Anterior mid chest. She continues to use the radiaplex to these areas, and will switch to a Vitamin E cream when finished.  She reports a decreased appetite despite using marinol which she reports makes her hungry, but she still has decreased desire to eat. She reports that she is worried to eat foods that might cause cancer, and this causes her anxiety. She has not followed up with her Medical oncologist at [facility] and has requested a referral to see somebody here in [place].  BP 101/63   Pulse 100   Temp 98.6 F (37 C)   Ht 5\' 3"  (1.6 m)   Wt 110 lb 6.4 oz (50.1 kg)   SpO2 100% Comment: room air  BMI 19.56 kg/m    Wt Readings from Last 3 Encounters:  01/20/16 110 lb 6.4 oz (50.1 kg)  12/29/15 114 lb (51.7 kg)  12/09/15 115 lb 9.6 oz (52.4 kg)

## 2016-01-23 ENCOUNTER — Inpatient Hospital Stay
Admission: RE | Admit: 2016-01-23 | Discharge: 2016-01-23 | Disposition: A | Discharge status: Home / Self Care | Payer: Self-pay | Source: Ambulatory Visit | Attending: Hematology and Oncology | Admitting: Hematology and Oncology

## 2016-01-23 ENCOUNTER — Other Ambulatory Visit: Payer: Self-pay | Admitting: Hematology and Oncology

## 2016-01-23 ENCOUNTER — Other Ambulatory Visit: Payer: Self-pay | Admitting: Radiation Oncology

## 2016-01-23 DIAGNOSIS — C801 Malignant (primary) neoplasm, unspecified: Secondary | ICD-10-CM

## 2016-01-24 ENCOUNTER — Telehealth: Payer: Self-pay | Admitting: Hematology and Oncology

## 2016-01-24 ENCOUNTER — Encounter: Payer: Self-pay | Admitting: Hematology and Oncology

## 2016-01-24 NOTE — Telephone Encounter (Signed)
Pt confirmed appt, completed intake, mailed pt letter °

## 2016-02-01 ENCOUNTER — Telehealth: Payer: Self-pay | Admitting: *Deleted

## 2016-02-01 NOTE — Telephone Encounter (Signed)
CALLED PATIENT TO INFORM OF PET SCAN ON 02-13-16- ARRIVAL TIME - 6:30 AM, NPO 6HRS. PRIOR TO TEST, PATIENT TO REPORT TO [facility] AND SHE WILL SEE THE NUTRITIONIST ON 02-13-16 @ 9:45 AM, SPOKE WITH PATIENT AND SHE IS AWARE OF THESE APPTS.

## 2016-02-06 ENCOUNTER — Encounter: Payer: BLUE CROSS/BLUE SHIELD | Admitting: Nutrition

## 2016-02-07 ENCOUNTER — Other Ambulatory Visit: Payer: Self-pay | Admitting: Hematology and Oncology

## 2016-02-09 ENCOUNTER — Ambulatory Visit (HOSPITAL_COMMUNITY): Payer: BLUE CROSS/BLUE SHIELD

## 2016-02-13 ENCOUNTER — Encounter: Payer: BLUE CROSS/BLUE SHIELD | Admitting: Nutrition

## 2016-02-13 ENCOUNTER — Encounter (HOSPITAL_COMMUNITY): Payer: BLUE CROSS/BLUE SHIELD

## 2016-02-13 ENCOUNTER — Encounter: Payer: Self-pay | Admitting: Nutrition

## 2016-02-13 ENCOUNTER — Other Ambulatory Visit: Payer: Self-pay | Admitting: Hematology and Oncology

## 2016-02-13 NOTE — Progress Notes (Signed)
Patient did not show up for nutrition appointment. 

## 2016-02-14 ENCOUNTER — Ambulatory Visit (HOSPITAL_BASED_OUTPATIENT_CLINIC_OR_DEPARTMENT_OTHER): Payer: BLUE CROSS/BLUE SHIELD | Admitting: Hematology and Oncology

## 2016-02-14 ENCOUNTER — Encounter: Payer: Self-pay | Admitting: Hematology and Oncology

## 2016-02-14 VITALS — BP 119/81 | HR 114 | Temp 98.3°F | Resp 20 | Ht 63.0 in | Wt 108.7 lb

## 2016-02-14 DIAGNOSIS — R110 Nausea: Secondary | ICD-10-CM | POA: Diagnosis not present

## 2016-02-14 DIAGNOSIS — C8520 Mediastinal (thymic) large B-cell lymphoma, unspecified site: Secondary | ICD-10-CM | POA: Diagnosis not present

## 2016-02-14 DIAGNOSIS — Z87891 Personal history of nicotine dependence: Secondary | ICD-10-CM | POA: Diagnosis not present

## 2016-02-14 DIAGNOSIS — Z800 Family history of malignant neoplasm of digestive organs: Secondary | ICD-10-CM

## 2016-02-14 DIAGNOSIS — Z803 Family history of malignant neoplasm of breast: Secondary | ICD-10-CM

## 2016-02-14 DIAGNOSIS — N6311 Unspecified lump in the right breast, upper outer quadrant: Secondary | ICD-10-CM | POA: Insufficient documentation

## 2016-02-14 DIAGNOSIS — N63 Unspecified lump in breast: Secondary | ICD-10-CM | POA: Diagnosis not present

## 2016-02-14 DIAGNOSIS — Z8041 Family history of malignant neoplasm of ovary: Secondary | ICD-10-CM

## 2016-02-14 DIAGNOSIS — C8522 Mediastinal (thymic) large B-cell lymphoma, intrathoracic lymph nodes: Secondary | ICD-10-CM

## 2016-02-14 HISTORY — DX: Nausea: R11.0

## 2016-02-14 MED ORDER — DRONABINOL 2.5 MG PO CAPS: ORAL_CAPSULE | ORAL | 0 refills | Status: DC

## 2016-02-14 MED ORDER — PROCHLORPERAZINE MALEATE 10 MG PO TABS: 10.0000 mg | ORAL_TABLET | Freq: Four times a day (QID) | ORAL | 1 refills | Status: DC | PRN

## 2016-02-14 NOTE — Assessment & Plan Note (Signed)
I refilled her prescriptions of Marinol and Compazine. I am not comfortable for long-term prescription refill for Marinol. I agreed to refill her prescription 1 more time only. Once a prescription of Marinol runs out, she should just use Compazine as needed for nausea

## 2016-02-14 NOTE — Progress Notes (Signed)
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 female is here because of request for transfer of care from [place] to [facility] I spent a lot of time reviewing her outside records and collaborated the history with the patient. The patient had complaints of shortness of breath as the initial presentation. She had multiple imaging studies done elsewhere which subsequently led to the diagnosis of diffuse large B-cell lymphoma. She had delay of initiation of treatment due to pregnancy. She subsequently completed 5 cycles of chemotherapy, complicated by nausea and hair loss along with peripheral neuropathy. The neuropathy subsequently resolved and her nausea is currently well controlled with antiemetics. She complained of shortness of breath and mild dizziness. The patient found out that she has become pregnant again today and is arranging for termination of pregnancy at the end of the week. She complained of palpable lumps on her right breast which is not tender She is scheduled to have PET CT scan next week to assess response to treatment. Summary of oncologic history is as summarized below:   Mediastinal (thymic) large B-cell lymphoma ([place])   07/07/2014 Imaging    CT at [facility] showed nonspecific prevascular masslike density 2.1 x 3.9 cm. Given the appearance and patient's age, this may represent an unusually prominent hyperplastic thymus, versus thymoma, lymphoma, etc. Assuming prior exams are not available to confirm stability, recommend chest MR with and without contrast including in and out of phase imaging for further evaluation      09/14/2014 Imaging    MR chest at [facility] showed partially  solid, partially cystic anterior mediastinal mass. The cystic component is new compared to the prior study 07/07/2014 and has mural nodularity. This may represent internal hemorrhage. The cystic component mural nodularity. The chemical shift ratio does not favor benign thymic hyperplasia. The differential is led by cystic thymoma. Lymphoma and germ cell tumor are also considerations, but felt less likely. If tissue diagnosis is not obtained, recommend 3 month follow-up thoracic MRI with contrast material.      12/21/2014 Imaging    MR chest at [facility] showed a soft tissue mass within the prevascular space of the anterior mediastinum. Note that this examination is limited due to suboptimal technique and the lack of intravenous contrast. Within this context, this lesion is stable to perhaps slightly decreased in size from the comparison study.The previously seen cystic component of this lesion is no longer as well identified, however this is most likely due to suboptimal technique.      04/10/2015 Imaging    CT chest no evidence of pulmonary embolus or right heart strain. Prominent central pulmonary artery trunk with no evidence of central pulmonary artery enlargement. Large anterior and right-sided mediastinal mass. Differential diagnosis includes thymoma, teratoma, lymphoma, and less likely extra medullary hematopoiesis Small right greater than left pleural effusions and peribronchial streaky opacities bilateral lower lobes subsegmental atelectasis or infiltrate      04/27/2015 Imaging    MR chest showed enlarging bilobed enhancing anterior mediastinal mass with central necrosis within the right-sided component. Broad abutment of the pericardium is concerning for possible invasion. There is possible encasement of the right mammary vessels. No definite evidence of vascular invasion of the SVC, left brachiocephalic vein, or  mammary vessels despite the close proximity of the mass. Based on the patient's  age, this remains concerning for germ cell tumor, thymoma, or lymphoma. Consider further evaluation with direct tissue sampling and germ cell tumor markers.      05/04/2015 PET scan    PET scan at [facility] showed hypermetabolic bilobed anterior mediastinal mass with central necrosis in the right sided component is concerning for malignancy with differential including germ cell tumor, thymoma/thymic carcinoma, or lymphoma. There is no evidence of FDG avid disease elsewhere in the chest, abdomen, or pelvis.      05/16/2015 Pathology Results    Mediastinal LN biopsy: The sections show a diffuse proliferation of medium to large-sized lymphoid cells with round and irregular nuclear contours and abundant eosinophilic to clear cytoplasm. Occasional multi-lobated cells are seen. Scattered mitotic figures and apoptotic bodies are present. There are thin bands of compartmentalizing fibrosis within the infiltrate in addition to thick bands of collagen fibrosis. After the initial morphologic evaluation, immunohistochemical stains are performed on block C1 (control tissue reacts properly). The abnormal lymphoid cells are B-cells, which stain with CD20, PAX-5 and CD79a. The abnormal cells are positive for CD23 and MUM-1 and are negative for CD10. CD3 and CD5 highlight many intermixed small T-cells. Approximately 10-20% of the cells express BCL-6, while the majority appear negative for BCL-2. The Ki-67 proliferation index is approximately 30-40%. The cytokeratin AE1/AE3 stain highlights background mesothelial cells. CD30 shows patchy, weak staining of the abnormal lymphoid cells, while CD15 appears to highlight granulocytic cells.           05/25/2015 Initial Diagnosis    She was seen at [facility]      05/30/2015 Imaging    Outside [facility] showed normal left ventricular wall motion. The left ventricular ejection fraction is 66.2%.      06/01/2015 Bone Marrow Biopsy    Outside bone marrow biopsy was negative       06/13/2015 - 09/14/2015 Chemotherapy    The patient had 5 cycles of RCHOP chemotherapy treatment at [facility]        07/22/2015 Imaging    CT chest showed residual solid anterior mediastinal mass, decreased in size from recent PET/CT. Residual or recurrent tumor is not excluded.      08/12/2015 PET scan    Decreased metabolic activity of the anterior mediastinal lymph nodes. No new sites of hypermetabolic disease. ([place] 3). Mild radiotracer uptake in the right upper anterior chest wall skin, which is likely related to focus of inflammation or sequelae of trauma.      09/29/2015 PET scan    Interval slight decrease in FDG activity in the dominant anterior mediastinal mass. A smaller adjacent mediastinal mass is unchanged from prior study. No evidence of new FDG avid disease ([place]ville 3). Interval decrease in FDG activity in the superficial tissues of the right anterior chest wall, favored to represent inflammatory change.      11/08/2015 - 12/09/2015 Radiation Therapy    She received radiation treatment to anterior Mediastinum treated to 41.40 Gy in 23 fractions        MEDICAL HISTORY:  Past Medical History:  Diagnosis Date  . 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 Chronic back pain   . Chronic nausea 02/14/2016  . Hx of radiation therapy 11/09/15- 12/09/15   Anterior Mediastinum   . Pain syndrome, chronic   . Spondylolysis of thoracic region   . Whiplash   . Wound dehiscence, surgical  SURGICAL HISTORY: Past Surgical History:  Procedure Laterality Date  . BRONCHOSCOPY  05/16/2015  . CENTRAL VENOUS CATHETER TUNNELED INSERTION SINGLE LUMEN     Port Placement, removed Dec 2016  . Thoracoscopy, mediastinal space with biopsy  05/16/15    SOCIAL HISTORY: Social History   Social History  . Marital status: Married    Spouse name: Isiah  . Number of children: 3  . Years of education: N/A   Occupational History  . home maker     Social History Main Topics  . Smoking status: Former Smoker    Packs/day: 0.50    Types: Cigarettes    Quit date: 06/26/2011  . Smokeless tobacco: Never Used  . Alcohol use 0.0 oz/week  . Drug use: No  . Sexual activity: Not on file   Other Topics Concern  . Not on file   Social History Narrative  . No narrative on file    FAMILY HISTORY: Family History  Problem Relation Age of Onset  . Breast cancer Paternal Aunt   . Emphysema Sister   . Fibromyalgia Mother   . Hypertension Mother   . Heart disease Paternal Aunt   . Hypertension Father   . Ovarian cancer Paternal Aunt   . Scleroderma Sister   . Stomach cancer Paternal Uncle   . Hodgkin's lymphoma Cousin     on father's side    ALLERGIES:  has No Known Allergies.  MEDICATIONS:  Current Outpatient Prescriptions  Medication Sig Dispense Refill  . dronabinol (MARINOL) 2.5 MG capsule Start taking 1 capsule BID, 1 hr before lunch and 1 hr before dinner.  After a few days, if needed, increase dinner dose to 2 capsules.  After a few more days, if needed, also increase lunch dose to 2 capsules. 90 capsule 0  . ibuprofen (ADVIL,MOTRIN) 600 MG tablet Take 600 mg by mouth every 8 (eight) hours as needed.    . traMADol (ULTRAM) 50 MG tablet Take 1 tablet (50 mg total) by mouth every 6 (six) hours as needed for moderate pain. 75 tablet 0  . lidocaine (XYLOCAINE) 2 % solution Patient: Mix 1part 2% viscous lidocaine, 1part H20. Swallow 107m of this mixture, 360m before meals and '@bedtime' , up to QID prn sore throat (Patient not taking: Reported on 12/09/2015) 100 mL 5  . prochlorperazine (COMPAZINE) 10 MG tablet Take 1 tablet (10 mg total) by mouth every 6 (six) hours as needed for nausea or vomiting. 90 tablet 1  . promethazine (PHENERGAN) 25 MG tablet Take 1 tablet (25 mg total) by mouth every 6 (six) hours as needed for nausea or vomiting. (Patient not taking: Reported on 12/29/2015) 30 tablet 3   No current facility-administered  medications for this visit.     REVIEW OF SYSTEMS:   Constitutional: Denies fevers, chills or abnormal night sweats Eyes: Denies blurriness of vision, double vision or watery eyes Ears, nose, mouth, throat, and face: Denies mucositis or sore throat Cardiovascular: Denies palpitation, chest discomfort or lower extremity swelling Gastrointestinal:  Denies nausea, heartburn or change in bowel habits Skin: Denies abnormal skin rashes Lymphatics: Denies new lymphadenopathy or easy bruising Neurological:Denies numbness, tingling or new weaknesses Behavioral/Psych: Mood is stable, no new changes  All other systems were reviewed with the patient and are negative.  PHYSICAL EXAMINATION: ECOG PERFORMANCE STATUS: 1 - Symptomatic but completely ambulatory  Vitals:   02/14/16 1147  BP: 119/81  Pulse: (!) 114  Resp: 20  Temp: 98.3 F (36.8 C)   Filed Weights  02/14/16 1147  Weight: 108 lb 11.2 oz (49.3 kg)    GENERAL:alert, no distress and comfortable SKIN: skin color, texture, turgor are normal, no rashes or significant lesions EYES: normal, conjunctiva are pink and non-injected, sclera clear OROPHARYNX:no exudate, no erythema and lips, buccal mucosa, and tongue normal  NECK: supple, thyroid normal size, non-tender, without nodularity LYMPH:  no palpable lymphadenopathy in the cervical, axillary or inguinal LUNGS: clear to auscultation and percussion with normal breathing effort HEART: noted tachycardia, no murmurs and no lower extremity edema ABDOMEN:abdomen soft, non-tender and normal bowel sounds Musculoskeletal:no cyanosis of digits and no clubbing  PSYCH: alert & oriented x 3 with fluent speech NEURO: no focal motor/sensory deficits Right breast examination revealed fibrocystic changes. Normal nipple. No dominant mass  LABORATORY DATA:  I have reviewed the data as listed Lab Results  Component Value Date   WBC 2.4 (L) 06/12/2015   HGB 11.3 (L) 06/12/2015   HCT 34.7 (L)  06/12/2015   MCV 84.0 06/12/2015   PLT 198 06/12/2015    Recent Labs  04/22/15 1353 10/31/15 0825  NA 138  --   K 4.1  --   CL 106  --   CO2 22  --   GLUCOSE 80  --   BUN 12 11.0  CREATININE 0.64 0.9  CALCIUM 9.2  --   GFRNONAA >60  --   GFRAA >60  --   PROT 8.1  --   ALBUMIN 4.2  --   AST 22  --   ALT 19  --   ALKPHOS 84  --   BILITOT 0.9  --     ASSESSMENT & PLAN:  Mediastinal (thymic) large B-cell lymphoma (HCC) The patient has completed 5 cycles of chemotherapy and radiation therapy. I have asked her to sign consent to release information so that we can get detailed information regarding her chemotherapy. Her radiation oncologist have ordered a PET CT scan to be done next week. I will call the patient with test results. PET/CT scan showed no evidence of received oh disease, she can be observed with return appointment in 3 months with history, physical examination and blood work only. I will get her case presented at the next hematology tumor board  Chronic nausea I refilled her prescriptions of Marinol and Compazine. I am not comfortable for long-term prescription refill for Marinol. I agreed to refill her prescription 1 more time only. Once a prescription of Marinol runs out, she should just use Compazine as needed for nausea   Breast lump on right side at 10 o'clock position She complained of breast lump Examination confirmed fibrocystic changes No dominant mass is noted. No nipple changes I suspect this is benign I will call the patient once we have PET CT scan available  Orders Placed This Encounter  Procedures  . CBC with Differential/Platelet    Standing Status:   Future    Standing Expiration Date:   03/20/2017  . Comprehensive metabolic panel    Standing Status:   Future    Standing Expiration Date:   03/20/2017     All questions were answered. The patient knows to call the clinic with any problems, questions or concerns. I spent 40 minutes  counseling the patient face to face. The total time spent in the appointment was 55 minutes and more than 50% was on counseling.     [facility], [place], MD 02/14/2016 1:52 PM

## 2016-02-14 NOTE — Assessment & Plan Note (Signed)
The patient has completed 5 cycles of chemotherapy and radiation therapy. I have asked her to sign consent to release information so that we can get detailed information regarding her chemotherapy. Her radiation oncologist have ordered a PET CT scan to be done next week. I will call the patient with test results. PET/CT scan showed no evidence of received oh disease, she can be observed with return appointment in 3 months with history, physical examination and blood work only. I will get her case presented at the next hematology tumor board

## 2016-02-14 NOTE — Assessment & Plan Note (Signed)
She complained of breast lump Examination confirmed fibrocystic changes No dominant mass is noted. No nipple changes I suspect this is benign I will call the patient once we have PET CT scan available

## 2016-02-21 ENCOUNTER — Ambulatory Visit (HOSPITAL_COMMUNITY)
Admission: RE | Admit: 2016-02-21 | Discharge: 2016-02-21 | Disposition: A | Discharge status: Home / Self Care | Payer: BLUE CROSS/BLUE SHIELD | Source: Ambulatory Visit | Attending: Radiation Oncology | Admitting: Radiation Oncology

## 2016-02-21 ENCOUNTER — Encounter (HOSPITAL_COMMUNITY): Payer: Self-pay | Admitting: Radiology

## 2016-02-21 DIAGNOSIS — C8520 Mediastinal (thymic) large B-cell lymphoma, unspecified site: Secondary | ICD-10-CM

## 2016-02-21 LAB — GLUCOSE, CAPILLARY: GLUCOSE-CAPILLARY: 88 mg/dL (ref 65–99)

## 2016-02-21 MED ORDER — FLUDEOXYGLUCOSE F - 18 (FDG) INJECTION: 5.4000 | Freq: Once | INTRAVENOUS | Status: DC | PRN

## 2016-02-23 ENCOUNTER — Telehealth: Payer: Self-pay | Admitting: Hematology and Oncology

## 2016-02-23 ENCOUNTER — Telehealth: Payer: Self-pay | Admitting: *Deleted

## 2016-02-23 NOTE — Telephone Encounter (Signed)
APPOINTMENT CONF WITH PATIENT PER 02/14/16 LOS.

## 2016-02-23 NOTE — Telephone Encounter (Signed)
Left VM for pt informing her of Dr. Calton Dach reply/message.  Asked pt to call nurse back if any questions.

## 2016-02-23 NOTE — Telephone Encounter (Signed)
Pt left VM asking about her PET scan results?

## 2016-02-23 NOTE — Telephone Encounter (Signed)
PET scan looks good There is a hematology tumor board next Tuesday and I will review her scans then with other doctors and call her after

## 2016-02-28 ENCOUNTER — Telehealth: Payer: Self-pay | Admitting: *Deleted

## 2016-02-28 NOTE — Telephone Encounter (Signed)
Medical clearance form faxed to [facility]

## 2016-03-06 ENCOUNTER — Telehealth: Payer: Self-pay | Admitting: *Deleted

## 2016-03-06 NOTE — Telephone Encounter (Signed)
I review the recommendation from recent tumor board evaluation. Overall, she has nothing to suggest recurrence of disease or disease progression. I will see her back in November as scheduled. I recommend influenza vaccination. I recommend repeat CT scan in 6 months and I will order the repeat imaging after a see her in November. I addressed all her questions and concerns

## 2016-03-06 NOTE — Telephone Encounter (Signed)
Pt asking about outcome of Heme tumor board meeting?

## 2016-03-23 ENCOUNTER — Emergency Department (HOSPITAL_COMMUNITY)
Admission: EM | Admit: 2016-03-23 | Discharge: 2016-03-23 | Disposition: A | Discharge status: Home / Self Care | Payer: BLUE CROSS/BLUE SHIELD | Attending: Emergency Medicine | Admitting: Emergency Medicine

## 2016-03-23 DIAGNOSIS — Z87891 Personal history of nicotine dependence: Secondary | ICD-10-CM | POA: Insufficient documentation

## 2016-03-23 DIAGNOSIS — Y999 Unspecified external cause status: Secondary | ICD-10-CM | POA: Diagnosis not present

## 2016-03-23 DIAGNOSIS — Z79899 Other long term (current) drug therapy: Secondary | ICD-10-CM | POA: Insufficient documentation

## 2016-03-23 DIAGNOSIS — Z23 Encounter for immunization: Secondary | ICD-10-CM | POA: Insufficient documentation

## 2016-03-23 DIAGNOSIS — S61511A Laceration without foreign body of right wrist, initial encounter: Secondary | ICD-10-CM | POA: Diagnosis not present

## 2016-03-23 DIAGNOSIS — Y92009 Unspecified place in unspecified non-institutional (private) residence as the place of occurrence of the external cause: Secondary | ICD-10-CM | POA: Insufficient documentation

## 2016-03-23 DIAGNOSIS — Z8572 Personal history of non-Hodgkin lymphomas: Secondary | ICD-10-CM | POA: Diagnosis not present

## 2016-03-23 DIAGNOSIS — W268XXA Contact with other sharp object(s), not elsewhere classified, initial encounter: Secondary | ICD-10-CM | POA: Insufficient documentation

## 2016-03-23 DIAGNOSIS — Y9389 Activity, other specified: Secondary | ICD-10-CM | POA: Diagnosis not present

## 2016-03-23 DIAGNOSIS — IMO0002 Reserved for concepts with insufficient information to code with codable children: Secondary | ICD-10-CM

## 2016-03-23 MED ORDER — LIDOCAINE HCL (PF) 1 % IJ SOLN
5.0000 mL | Freq: Once | INTRAMUSCULAR | Status: AC
  Administered 2016-03-23: 5 mL via INTRADERMAL
  Filled 2016-03-23: qty 30

## 2016-03-23 MED ORDER — OXYCODONE-ACETAMINOPHEN 5-325 MG PO TABS
1.0000 | ORAL_TABLET | Freq: Once | ORAL | Status: AC
  Administered 2016-03-23: 1 via ORAL
  Filled 2016-03-23: qty 1

## 2016-03-23 MED ORDER — TETANUS-DIPHTH-ACELL PERTUSSIS 5-2.5-18.5 LF-MCG/0.5 IM SUSP
0.5000 mL | Freq: Once | INTRAMUSCULAR | Status: AC
  Administered 2016-03-23: 0.5 mL via INTRAMUSCULAR
  Filled 2016-03-23: qty 0.5

## 2016-03-23 NOTE — ED Triage Notes (Signed)
Per EMS, pt  Has Laceration to rt wrist, sustained when she went to throw away a plate,  Cutting her wrist on  a plate that was broken already Dexterity in hand, Bleeding under control upon arrival to home.

## 2016-03-23 NOTE — ED Notes (Signed)
Bed: [id number] Expected date:  Expected time:  Means of arrival:  Comments: 30yo F laceration

## 2016-03-23 NOTE — Discharge Instructions (Signed)
Follow-up with local urgent care or return here for suture removal in 7 days. Keep sutures clean and dry.

## 2016-03-23 NOTE — ED Provider Notes (Signed)
[facility] DEPT Provider Note   CSN: [id number] Arrival date & time: 03/23/16  0045     History   Chief Complaint Chief Complaint  Patient presents with  . Laceration    HPI Katrina Aguilar is a 30 y.o. female.  The history is provided by the patient and medical records.    30 year old female with history of chronic back pain, chronic nausea, history of B-cell lymphoma, presenting to the ED for laceration. Patient reports she was cleaning up a broken plate that had fallen on the floor and was attempting to throw the pieces in the garbage when she accidentally cut her right wrist. States this was purely accidental, no intention to harm herself. Sustained 4 cm laceration to right volar wrist. Bleeding well controlled on arrival. Date of last tetanus unknown. Denies any numbness or weakness of her right hand. She is right-hand dominant.  Past Medical History:  Diagnosis Date  . 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 Chronic back pain   . Chronic nausea 02/14/2016  . Hx of radiation therapy 11/09/15- 12/09/15   Anterior Mediastinum   . Pain syndrome, chronic   . Spondylolysis of thoracic region   . Whiplash   . Wound dehiscence, surgical     Patient Active Problem List   Diagnosis Date Noted  . Chronic nausea 02/14/2016  . Breast lump on right side at 10 o'clock position 02/14/2016  . Mediastinal (thymic) large B-cell lymphoma ([place]) 10/25/2015  . Chronic pain associated with significant psychosocial dysfunction 10/10/2015  . 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 BRONCHOSCOPY  05/16/2015  . CENTRAL VENOUS CATHETER TUNNELED INSERTION SINGLE LUMEN     Port Placement, removed Dec 2016  . Thoracoscopy, mediastinal space with biopsy  05/16/15    OB History    No data available       Home Medications    Prior to Admission medications   Medication Sig Start Date End Date Taking?  Authorizing Provider  dronabinol (MARINOL) 2.5 MG capsule Start taking 1 capsule BID, 1 hr before lunch and 1 hr before dinner.  After a few days, if needed, increase dinner dose to 2 capsules.  After a few more days, if needed, also increase lunch dose to 2 capsules. 02/14/16   Heath Lark, MD  ibuprofen (ADVIL,MOTRIN) 600 MG tablet Take 600 mg by mouth every 8 (eight) hours as needed.    Historical Provider, MD  lidocaine (XYLOCAINE) 2 % solution Patient: Mix 1part 2% viscous lidocaine, 1part H20. Swallow 82mL of this mixture, 22min before meals and @bedtime , up to QID prn sore throat Patient not taking: Reported on 12/09/2015 11/14/15   Eppie Gibson, MD  prochlorperazine (COMPAZINE) 10 MG tablet Take 1 tablet (10 mg total) by mouth every 6 (six) hours as needed for nausea or vomiting. 02/14/16   Heath Lark, MD  promethazine (PHENERGAN) 25 MG tablet Take 1 tablet (25 mg total) by mouth every 6 (six) hours as needed for nausea or vomiting. Patient not taking: Reported on 12/29/2015 12/05/15   Eppie Gibson, MD  traMADol (ULTRAM) 50 MG tablet Take 1 tablet (50 mg total) by mouth every 6 (six) hours as needed for moderate pain. 12/05/15   Eppie Gibson, MD    Family History Family History  Problem Relation Age of Onset  . Breast cancer Paternal Aunt   . Emphysema Sister   . Fibromyalgia Mother   . Hypertension  Mother   . Heart disease Paternal Aunt   . Hypertension Father   . Ovarian cancer Paternal Aunt   . Scleroderma Sister   . Stomach cancer Paternal Uncle   . Hodgkin's lymphoma Cousin     on father's side    Social History Social History  Substance Use Topics  . Smoking status: Former Smoker    Packs/day: 0.50    Types: Cigarettes    Quit date: 06/26/2011  . Smokeless tobacco: Never Used  . Alcohol use 0.0 oz/week     Allergies   Review of patient's allergies indicates no known allergies.   Review of Systems Review of Systems  Skin: Positive for wound.  All other systems reviewed  and are negative.    Physical Exam Updated Vital Signs BP 120/85 (BP Location: Left Arm)   Pulse 79   Temp 98.4 F (36.9 C) (Oral)   Resp 18   Ht 5\' 3"  (1.6 m)   Wt 49.9 kg   SpO2 100%   BMI 19.49 kg/m   Physical Exam  Constitutional: She is oriented to person, place, and time. She appears well-developed and well-nourished.  HENT:  Head: Normocephalic and atraumatic.  Mouth/Throat: Oropharynx is clear and moist.  Eyes: Conjunctivae and EOM are normal. Pupils are equal, round, and reactive to light.  Neck: Normal range of motion.  Cardiovascular: Normal rate, regular rhythm and normal heart sounds.   Pulmonary/Chest: Effort normal and breath sounds normal.  Abdominal: Soft. Bowel sounds are normal.  Musculoskeletal: Normal range of motion.  4 cm irregular laceration noted to the right lower wrist, bleeding well controlled; no evidence of deep tissue, vessel, or tendon involvement; full range of motion of wrist, hand, and all fingers; strong radial pulse and cap refill, normal sensation throughout, normal grip strength  Neurological: She is alert and oriented to person, place, and time.  Skin: Skin is warm and dry.  Psychiatric: She has a normal mood and affect.  Nursing note and vitals reviewed.    ED Treatments / Results  Labs (all labs ordered are listed, but only abnormal results are displayed) Labs Reviewed - No data to display  EKG  EKG Interpretation None       Radiology No results found.  Procedures Procedures (including critical care time)  LACERATION REPAIR Performed by: Larene Pickett Authorized by: Larene Pickett Consent: Verbal consent obtained. Risks and benefits: risks, benefits and alternatives were discussed Consent given by: patient Patient identity confirmed: provided demographic data Prepped and Draped in normal sterile fashion Wound explored  Laceration Location: right volar wrist  Laceration Length: 4cm, irregular  No Foreign  Bodies seen or palpated  Anesthesia: local infiltration  Local anesthetic: lidocaine 1% without epinephrine  Anesthetic total: 4 ml  Irrigation method: syringe Amount of cleaning: standard  Skin closure: 4-0 prolene  Number of sutures: 4  Technique: simple interrupted  Patient tolerance: Patient tolerated the procedure well with no immediate complications.   Medications Ordered in ED Medications  Tdap (BOOSTRIX) injection 0.5 mL (0.5 mLs Intramuscular Given 03/23/16 0242)  lidocaine (PF) (XYLOCAINE) 1 % injection 5 mL (5 mLs Intradermal Given 03/23/16 0334)  oxyCODONE-acetaminophen (PERCOCET/ROXICET) 5-325 MG per tablet 1 tablet (1 tablet Oral Given 03/23/16 0334)     Initial Impression / Assessment and Plan / ED Course  I have reviewed the triage vital signs and the nursing notes.  Pertinent labs & imaging results that were available during my care of the patient were reviewed by me  and considered in my medical decision making (see chart for details).  Clinical Course   30 year old female here with accidental laceration to right volar wrist from a broken plate. Bleeding is well controlled on arrival. There is no evidence of deep tissue, vessel, or tendon involvement. Right hand is neurovascularly intact, warm and well-perfused. Tetanus was updated here. Laceration repaired as above, patient tolerated well. Discharge home with wound care instructions, suture removal in 1 week.  Discussed plan with patient, she acknowledged understanding and agreed with plan of care.  Return precautions given for new or worsening symptoms.  Final Clinical Impressions(s) / ED Diagnoses   Final diagnoses:  Laceration    New Prescriptions New Prescriptions   No medications on file     Kathryne Hitch 03/23/16 [place], MD 03/23/16 [phone]

## 2016-03-23 NOTE — ED Notes (Signed)
MD at bedside. 

## 2016-03-30 ENCOUNTER — Ambulatory Visit (INDEPENDENT_AMBULATORY_CARE_PROVIDER_SITE_OTHER): Payer: BLUE CROSS/BLUE SHIELD | Admitting: Physician Assistant

## 2016-03-30 VITALS — BP 116/70 | HR 77 | Temp 98.1°F | Resp 18 | Ht 63.0 in | Wt 114.0 lb

## 2016-03-30 DIAGNOSIS — S61411A Laceration without foreign body of right hand, initial encounter: Secondary | ICD-10-CM | POA: Diagnosis not present

## 2016-03-30 DIAGNOSIS — Z4802 Encounter for removal of sutures: Secondary | ICD-10-CM | POA: Diagnosis not present

## 2016-03-30 DIAGNOSIS — Z23 Encounter for immunization: Secondary | ICD-10-CM

## 2016-03-30 NOTE — Progress Notes (Signed)
Chief Complaint  Patient presents with  . Suture / Staple Removal    History of Present Illness: Patient presents for suture removal.  She presented to the ED on 03/23/2016 with a laceration to the RIGHT hand. The injury occurred accidentally, cleaning up pieces of a broken plate. The wound was repaired with sutures. Tdap was administered. She still has pain, but no redness, swelling, drainage or fever.   No Known Allergies  Prior to Admission medications   Medication Sig Start Date End Date Taking? Authorizing Provider  dronabinol (MARINOL) 2.5 MG capsule Start taking 1 capsule BID, 1 hr before lunch and 1 hr before dinner.  After a few days, if needed, increase dinner dose to 2 capsules.  After a few more days, if needed, also increase lunch dose to 2 capsules. Patient not taking: Reported on 03/30/2016 02/14/16   Heath Lark, MD  lidocaine (XYLOCAINE) 2 % solution Patient: Mix 1part 2% viscous lidocaine, 1part H20. Swallow 9mL of this mixture, 67min before meals and @bedtime , up to QID prn sore throat Patient not taking: Reported on 03/30/2016 11/14/15   Eppie Gibson, MD  prochlorperazine (COMPAZINE) 10 MG tablet Take 1 tablet (10 mg total) by mouth every 6 (six) hours as needed for nausea or vomiting. Patient not taking: Reported on 03/30/2016 02/14/16   Heath Lark, MD  promethazine (PHENERGAN) 25 MG tablet Take 1 tablet (25 mg total) by mouth every 6 (six) hours as needed for nausea or vomiting. Patient not taking: Reported on 03/30/2016 12/05/15   Eppie Gibson, MD  traMADol (ULTRAM) 50 MG tablet Take 1 tablet (50 mg total) by mouth every 6 (six) hours as needed for moderate pain. Patient not taking: Reported on 03/30/2016 12/05/15   Eppie Gibson, MD    Patient Active Problem List   Diagnosis Date Noted  . Chronic nausea 02/14/2016  . Breast lump on right side at 10 o'clock position 02/14/2016  . 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 Chronic pain associated with  significant psychosocial dysfunction 10/10/2015  . 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 4 cm wound on the RIGHT wrist appears well-healed. Small amount of scab noted. No erythema, edema or drainage. Not indurated. Tender. #4 Prolene SI sutures are intact, and removed without incident.    ASSESSMENT & PLAN:  1. Encounter for removal of sutures 2. Laceration of right hand without foreign body, initial encounter Sutures removed without incident. Local wound care. Monitor for symptoms of infection. Anticipatory guidance provided.  3. Need for prophylactic vaccination and inoculation against influenza - Flu Vaccine QUAD 36+ mos IM   Fara Chute, PA-C Physician Assistant-Certified [facility]

## 2016-03-30 NOTE — Patient Instructions (Addendum)
Continue washing daily (or more) with soap and water. Advance your activity as tolerated.    IF you received an x-ray today, you will receive an invoice from [facility]. Please contact [facility] at [phone] with questions or concerns regarding your invoice.   IF you received labwork today, you will receive an invoice from [employer]. Please contact [facility] at [phone] with questions or concerns regarding your invoice.   Our billing staff will not be able to assist you with questions regarding bills from these companies.  You will be contacted with the lab results as soon as they are available. The fastest way to get your results is to activate your My Chart account. Instructions are located on the last page of this paperwork. If you have not heard from [place] regarding the results in 2 weeks, please contact this office.

## 2016-04-10 ENCOUNTER — Other Ambulatory Visit: Payer: Self-pay | Admitting: Physician Assistant

## 2016-04-10 ENCOUNTER — Ambulatory Visit
Admission: RE | Admit: 2016-04-10 | Discharge: 2016-04-10 | Disposition: A | Discharge status: Home / Self Care | Payer: BLUE CROSS/BLUE SHIELD | Source: Ambulatory Visit | Attending: Physician Assistant | Admitting: Physician Assistant

## 2016-04-10 DIAGNOSIS — R079 Chest pain, unspecified: Secondary | ICD-10-CM

## 2016-05-24 ENCOUNTER — Ambulatory Visit (HOSPITAL_BASED_OUTPATIENT_CLINIC_OR_DEPARTMENT_OTHER): Payer: BLUE CROSS/BLUE SHIELD | Admitting: Hematology and Oncology

## 2016-05-24 ENCOUNTER — Telehealth: Payer: Self-pay | Admitting: Hematology and Oncology

## 2016-05-24 ENCOUNTER — Other Ambulatory Visit (HOSPITAL_BASED_OUTPATIENT_CLINIC_OR_DEPARTMENT_OTHER): Payer: BLUE CROSS/BLUE SHIELD

## 2016-05-24 ENCOUNTER — Encounter: Payer: Self-pay | Admitting: Hematology and Oncology

## 2016-05-24 VITALS — BP 129/99 | HR 92 | Temp 98.5°F | Resp 18 | Ht 63.0 in | Wt 114.9 lb

## 2016-05-24 DIAGNOSIS — Z515 Encounter for palliative care: Secondary | ICD-10-CM | POA: Diagnosis not present

## 2016-05-24 DIAGNOSIS — C8520 Mediastinal (thymic) large B-cell lymphoma, unspecified site: Secondary | ICD-10-CM

## 2016-05-24 DIAGNOSIS — C8522 Mediastinal (thymic) large B-cell lymphoma, intrathoracic lymph nodes: Secondary | ICD-10-CM

## 2016-05-24 LAB — CBC WITH DIFFERENTIAL/PLATELET
BASO%: 0.5 % (ref 0.0–2.0)
Basophils Absolute: 0 10*3/uL (ref 0.0–0.1)
EOS ABS: 0.1 10*3/uL (ref 0.0–0.5)
EOS%: 1.6 % (ref 0.0–7.0)
HCT: 37.5 % (ref 34.8–46.6)
HEMOGLOBIN: 12.8 g/dL (ref 11.6–15.9)
LYMPH%: 14.8 % (ref 14.0–49.7)
MCH: 32 pg (ref 25.1–34.0)
MCHC: 34.1 g/dL (ref 31.5–36.0)
MCV: 93.8 fL (ref 79.5–101.0)
MONO#: 0.2 10*3/uL (ref 0.1–0.9)
MONO%: 6 % (ref 0.0–14.0)
NEUT%: 77.1 % — ABNORMAL HIGH (ref 38.4–76.8)
NEUTROS ABS: 3 10*3/uL (ref 1.5–6.5)
PLATELETS: 258 10*3/uL (ref 145–400)
RBC: 4 10*6/uL (ref 3.70–5.45)
RDW: 13.1 % (ref 11.2–14.5)
WBC: 3.9 10*3/uL (ref 3.9–10.3)
lymph#: 0.6 10*3/uL — ABNORMAL LOW (ref 0.9–3.3)

## 2016-05-24 LAB — COMPREHENSIVE METABOLIC PANEL
ALBUMIN: 4.1 g/dL (ref 3.5–5.0)
ALK PHOS: 62 U/L (ref 40–150)
ALT: 27 U/L (ref 0–55)
ANION GAP: 9 meq/L (ref 3–11)
AST: 20 U/L (ref 5–34)
BILIRUBIN TOTAL: 0.66 mg/dL (ref 0.20–1.20)
BUN: 18.2 mg/dL (ref 7.0–26.0)
CO2: 24 mEq/L (ref 22–29)
Calcium: 9.9 mg/dL (ref 8.4–10.4)
Chloride: 108 mEq/L (ref 98–109)
Creatinine: 0.8 mg/dL (ref 0.6–1.1)
GLUCOSE: 85 mg/dL (ref 70–140)
Potassium: 3.8 mEq/L (ref 3.5–5.1)
Sodium: 140 mEq/L (ref 136–145)
TOTAL PROTEIN: 8.1 g/dL (ref 6.4–8.3)

## 2016-05-24 NOTE — Assessment & Plan Note (Signed)
We reviewed treatment recommendations. I recommend vitamin D supplements. She has received influenza vaccination. The patient desire to get pregnant in the future. We discussed the timing of pregnancy, preferably after the 2 years of being cancer free before she attempts becoming pregnant again.

## 2016-05-24 NOTE — Progress Notes (Signed)
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 Given the appearance and patient's age, this may represent an unusually prominent hyperplastic thymus, versus thymoma, lymphoma, etc. Assuming prior exams are not available to confirm stability, recommend chest MR with and without contrast including in and out of phase imaging for further evaluation      09/14/2014 Imaging    MR chest at [facility] showed partially solid, partially cystic anterior mediastinal mass. The cystic component is new compared to the prior study 07/07/2014 and has mural nodularity. This may represent internal hemorrhage. The cystic component mural nodularity. The chemical shift ratio does not favor benign thymic hyperplasia. The differential is led by cystic thymoma. Lymphoma and germ cell tumor are also considerations, but felt less likely. If tissue diagnosis is not obtained, recommend 3 month follow-up thoracic MRI with contrast material.      12/21/2014 Imaging    MR chest at [facility] showed a soft tissue mass within the prevascular space of the anterior mediastinum. Note that this examination is limited due to suboptimal technique and the lack of intravenous contrast. Within this context, this lesion is stable to perhaps slightly decreased in size from the comparison study.The previously seen cystic component of this lesion is no longer as well identified, however this is most likely due to suboptimal technique.      04/10/2015 Imaging    CT chest no evidence of pulmonary embolus or right heart strain. Prominent central pulmonary artery trunk with no evidence of central pulmonary artery enlargement. Large anterior and right-sided mediastinal mass. Differential diagnosis  includes thymoma, teratoma, lymphoma, and less likely extra medullary hematopoiesis Small right greater than left pleural effusions and peribronchial streaky opacities bilateral lower lobes subsegmental atelectasis or infiltrate      04/27/2015 Imaging    MR chest showed enlarging bilobed enhancing anterior mediastinal mass with central necrosis within the right-sided component. Broad abutment of the pericardium is concerning for possible invasion. There is possible encasement of the right mammary vessels. No definite evidence of vascular invasion of the SVC, left brachiocephalic vein, or mammary vessels despite the close proximity of the mass. Based on the patient's age, this remains concerning for germ cell tumor, thymoma, or lymphoma. Consider further evaluation with direct tissue sampling and germ cell tumor markers.      05/04/2015 PET scan    PET scan at [facility] showed hypermetabolic bilobed anterior mediastinal mass with central necrosis in the right sided component is concerning for malignancy with differential including germ cell tumor, thymoma/thymic carcinoma, or lymphoma. There is no evidence of FDG avid disease elsewhere in the chest, abdomen, or pelvis.      05/16/2015 Pathology Results    Mediastinal LN biopsy: The sections show a diffuse proliferation of medium to large-sized lymphoid cells with round and irregular nuclear contours and abundant eosinophilic to clear cytoplasm. Occasional multi-lobated cells are seen. Scattered mitotic figures and apoptotic bodies are present. There are thin bands of compartmentalizing fibrosis within the infiltrate in addition to thick bands of collagen fibrosis. After the initial morphologic evaluation, immunohistochemical stains are performed on block C1 (control tissue reacts properly). The abnormal lymphoid cells are B-cells, which stain with CD20, PAX-5 and CD79a. The abnormal cells are positive for CD23 and MUM-1  and are negative for CD10. CD3 and CD5  highlight many intermixed small T-cells. Approximately 10-20% of the cells express BCL-6, while the majority appear negative for BCL-2. The Ki-67 proliferation index is approximately 30-40%. The cytokeratin AE1/AE3 stain highlights background mesothelial cells. CD30 shows patchy, weak staining of the abnormal lymphoid cells, while CD15 appears to highlight granulocytic cells.           05/25/2015 Initial Diagnosis    She was seen at [facility]      05/30/2015 Imaging    Outside [facility] showed normal left ventricular wall motion. The left ventricular ejection fraction is 66.2%.      06/01/2015 Bone Marrow Biopsy    Outside bone marrow biopsy was negative      06/13/2015 - 09/14/2015 Chemotherapy    The patient had 5 cycles of RCHOP chemotherapy treatment at [facility]        07/22/2015 Imaging    CT chest showed residual solid anterior mediastinal mass, decreased in size from recent PET/CT. Residual or recurrent tumor is not excluded.      08/12/2015 PET scan    Decreased metabolic activity of the anterior mediastinal lymph nodes. No new sites of hypermetabolic disease. ([place] 3). Mild radiotracer uptake in the right upper anterior chest wall skin, which is likely related to focus of inflammation or sequelae of trauma.      09/29/2015 PET scan    Interval slight decrease in FDG activity in the dominant anterior mediastinal mass. A smaller adjacent mediastinal mass is unchanged from prior study. No evidence of new FDG avid disease ([place] 3). Interval decrease in FDG activity in the superficial tissues of the right anterior chest wall, favored to represent inflammatory change.      11/08/2015 - 12/09/2015 Radiation Therapy    She received radiation treatment to anterior Mediastinum treated to 41.40 Gy in 23 fractions      02/21/2016 PET scan    Since the PET of 09/28/2015, decreased size of an anterior mediastinal mass with unchanged hypermetabolism, similar to the mediastinal pool. ([place]  2). No this new sites of disease identified       INTERVAL HISTORY: Please see below for problem oriented charting. She returns for follow-up. She denies new lymphadenopathy. No recent infection. Clinically, she feels well. No recent chest pain or cough. She complained of minor tingling and numbness over her port area and the right upper arm, stable from before  REVIEW OF SYSTEMS:   Constitutional: Denies fevers, chills or abnormal weight loss Eyes: Denies blurriness of vision Ears, nose, mouth, throat, and face: Denies mucositis or sore throat Respiratory: Denies cough, dyspnea or wheezes Cardiovascular: Denies palpitation, chest discomfort or lower extremity swelling Gastrointestinal:  Denies nausea, heartburn or change in bowel habits Skin: Denies abnormal skin rashes Lymphatics: Denies new lymphadenopathy or easy bruising Neurological:Denies numbness, tingling or new weaknesses Behavioral/Psych: Mood is stable, no new changes  All other systems were reviewed with the patient and are negative.  I have reviewed the past medical history, past surgical history, social history and family history with the patient and they are unchanged from previous note.  ALLERGIES:  has No Known Allergies.  MEDICATIONS:  No current outpatient prescriptions on file.   No current facility-administered medications for this visit.     PHYSICAL EXAMINATION: ECOG PERFORMANCE STATUS: 0 - Asymptomatic  Vitals:   05/24/16 1214  BP: (!) 129/99  Pulse: 92  Resp: 18  Temp: 98.5 F (36.9 C)   Filed Weights   05/24/16  1214  Weight: 114 lb 14.4 oz (52.1 kg)    GENERAL:alert, no distress and comfortable SKIN: skin color, texture, turgor are normal, no rashes or significant lesions EYES: normal, Conjunctiva are pink and non-injected, sclera clear OROPHARYNX:no exudate, no erythema and lips, buccal mucosa, and tongue normal  NECK: supple, thyroid normal size, non-tender, without  nodularity LYMPH:  no palpable lymphadenopathy in the cervical, axillary or inguinal LUNGS: clear to auscultation and percussion with normal breathing effort HEART: regular rate & rhythm and no murmurs and no lower extremity edema ABDOMEN:abdomen soft, non-tender and normal bowel sounds Musculoskeletal:no cyanosis of digits and no clubbing  NEURO: alert & oriented x 3 with fluent speech, no focal motor/sensory deficits  LABORATORY DATA:  I have reviewed the data as listed    Component Value Date/Time   NA 140 05/24/2016 1151   K 3.8 05/24/2016 1151   CL 106 04/22/2015 1353   CO2 24 05/24/2016 1151   GLUCOSE 85 05/24/2016 1151   BUN 18.2 05/24/2016 1151   CREATININE 0.8 05/24/2016 1151   CALCIUM 9.9 05/24/2016 1151   PROT 8.1 05/24/2016 1151   ALBUMIN 4.1 05/24/2016 1151   AST 20 05/24/2016 1151   ALT 27 05/24/2016 1151   ALKPHOS 62 05/24/2016 1151   BILITOT 0.66 05/24/2016 1151   GFRNONAA >60 04/22/2015 1353   GFRAA >60 04/22/2015 1353    No results found for: SPEP, UPEP  Lab Results  Component Value Date   WBC 3.9 05/24/2016   NEUTROABS 3.0 05/24/2016   HGB 12.8 05/24/2016   HCT 37.5 05/24/2016   MCV 93.8 05/24/2016   PLT 258 05/24/2016      Chemistry      Component Value Date/Time   NA 140 05/24/2016 1151   K 3.8 05/24/2016 1151   CL 106 04/22/2015 1353   CO2 24 05/24/2016 1151   BUN 18.2 05/24/2016 1151   CREATININE 0.8 05/24/2016 1151      Component Value Date/Time   CALCIUM 9.9 05/24/2016 1151   ALKPHOS 62 05/24/2016 1151   AST 20 05/24/2016 1151   ALT 27 05/24/2016 1151   BILITOT 0.66 05/24/2016 1151       ASSESSMENT & PLAN:  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 Clinically, she has no signs of disease recurrence. I will order a CT scan of the chest to be done in January, the day before she sees a radiation oncologist.  If the imaging study showed no evidence  of cancer recurrence, I plan to see her back in April with repeat blood work, history and physical examination only  Quality of life palliative care encounter We reviewed treatment recommendations. I recommend vitamin D supplements. She has received influenza vaccination. The patient desire to get pregnant in the future. We discussed the timing of pregnancy, preferably after the 2 years of being cancer free before she attempts becoming pregnant again.   Orders Placed This Encounter  Procedures  . CT CHEST W CONTRAST    Standing Status:   Future    Standing Expiration Date:   06/28/2017    Order Specific Question:   Reason for exam:    Answer:   lymphoma, exclude recurrence    Order Specific Question:   Is the patient pregnant?    Answer:   No    Order Specific Question:   Preferred imaging location?    Answer:   [facility]  . Comprehensive metabolic panel  Standing Status:   Future    Standing Expiration Date:   06/28/2017  . CBC with Differential/Platelet    Standing Status:   Future    Standing Expiration Date:   06/28/2017  . Lactate dehydrogenase    Standing Status:   Future    Standing Expiration Date:   06/28/2017   All questions were answered. The patient knows to call the clinic with any problems, questions or concerns. No barriers to learning was detected. I spent 15 minutes counseling the patient face to face. The total time spent in the appointment was 25 minutes and more than 50% was on counseling and review of test results     Heath Lark, MD 05/24/2016 1:33 PM

## 2016-05-24 NOTE — Assessment & Plan Note (Signed)
The patient has completed 5 cycles of chemotherapy and radiation therapy. Clinically, she has no signs of disease recurrence. I will order a CT scan of the chest to be done in January, the day before she sees a radiation oncologist.  If the imaging study showed no evidence of cancer recurrence, I plan to see her back in April with repeat blood work, history and physical examination only

## 2016-05-24 NOTE — Telephone Encounter (Signed)
Appointments scheduled per 11/30 LOS. Patient given AVS report and calendars with future scheduled appointments. °

## 2016-07-17 ENCOUNTER — Telehealth: Payer: Self-pay

## 2016-07-17 NOTE — Telephone Encounter (Signed)
Left message on voicemail to inform of upcoming CT.

## 2016-07-19 ENCOUNTER — Ambulatory Visit (HOSPITAL_COMMUNITY)
Admission: RE | Admit: 2016-07-19 | Discharge: 2016-07-19 | Disposition: A | Discharge status: Home / Self Care | Payer: BLUE CROSS/BLUE SHIELD | Source: Ambulatory Visit | Attending: Hematology and Oncology | Admitting: Hematology and Oncology

## 2016-07-19 DIAGNOSIS — R911 Solitary pulmonary nodule: Secondary | ICD-10-CM | POA: Insufficient documentation

## 2016-07-19 DIAGNOSIS — C8522 Mediastinal (thymic) large B-cell lymphoma, intrathoracic lymph nodes: Secondary | ICD-10-CM | POA: Insufficient documentation

## 2016-07-19 MED ORDER — IOPAMIDOL (ISOVUE-300) INJECTION 61%
75.0000 mL | Freq: Once | INTRAVENOUS | Status: AC | PRN
  Administered 2016-07-19: 75 mL via INTRAVENOUS

## 2016-07-20 ENCOUNTER — Encounter: Payer: Self-pay | Admitting: Radiation Oncology

## 2016-07-20 ENCOUNTER — Telehealth: Payer: Self-pay

## 2016-07-20 ENCOUNTER — Ambulatory Visit
Admission: RE | Admit: 2016-07-20 | Discharge: 2016-07-20 | Disposition: A | Discharge status: Home / Self Care | Payer: 59 | Source: Ambulatory Visit | Attending: Radiation Oncology | Admitting: Radiation Oncology

## 2016-07-20 DIAGNOSIS — C8522 Mediastinal (thymic) large B-cell lymphoma, intrathoracic lymph nodes: Secondary | ICD-10-CM | POA: Diagnosis not present

## 2016-07-20 DIAGNOSIS — R079 Chest pain, unspecified: Secondary | ICD-10-CM | POA: Diagnosis not present

## 2016-07-20 DIAGNOSIS — Z923 Personal history of irradiation: Secondary | ICD-10-CM | POA: Diagnosis not present

## 2016-07-20 NOTE — Progress Notes (Signed)
Radiation Oncology         [phone]) [phone] ________________________________  Name: Katrina Aguilar MRN: [id number]  Date: 07/20/2016  DOB: [birth year]  Follow-Up Visit Note  Outpatient  CC: No PCP Per Patient  No ref. provider found  Diagnosis and Prior Radiotherapy:    ICD-9-CM ICD-10-CM   1. 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  On review of systems, the patient reports aches to her Right Lateral side at times. She also reports sharp pain to her Left Chest area about once a month which lasts for a short period of time, but will take her breath away since completing radiation. She reports eating well and has gained some weight. She reports things are going well at home. She denies shortness of breath except with exertion.   The patient had a chest CT performed yesterday, showing within the anterior mediastinum there is a partially calcified soft tissue attenuating nodule measuring 2.1 x 1.4 x 2.3 cm. On previous exam on 07/22/15 this measured 2.9 x 1.6 x 2.6 cm. Paramediastinal fibrotic changes are identified with both upper lobes compatible with changes of external beam radiation. 3 mm Lung nodule within right upper lobe is identified, which was not definitively noted on prior scans.  This is not suspicious to my eye but warrants followup.  She reports that in August last year she realized she was several weeks pregnant.  She believes this is why she was losing weight and nauseous.  She decided to terminate her pregnancy and is at peace with that decision. She reports that she and her significant other felt her body had been under too much stress to endure  another pregnancy.  She is using Depo birth control now.   ALLERGIES:  has No Known Allergies.  Meds: No current outpatient prescriptions on file.   No current facility-administered medications for this encounter.     Physical Findings:  weight is 117 lb 6.4 oz (53.3 kg). Her temperature is 98.2 F (36.8 C). Her blood pressure is 132/91 (abnormal) and her pulse is 81. Her oxygen saturation is 100%.   Wt Readings from Last 3 Encounters:  07/20/16 117 lb 6.4 oz (53.3 kg)  05/24/16 114 lb 14.4 oz (52.1 kg)  03/30/16 114 lb (51.7 kg)   General: Alert and oriented, in no acute distress. HEENT: Oropharynx and oral cavity are clear. Neck: Neck is supple, no palpable cervical or supraclavicular lymphadenopathy or masses. Heart: Regular in rate and rhythm with no murmurs. Chest: Clear to auscultation bilaterally. Lymphatics: No left axillary masses nor any left epitrochlear, antecubital  masses. On the right side, no epitrochlear or antecubital , or axillary masses. In the right groin and right popliteal regions no masses noted. In the left groin and popliteal regions no masses noted. Skin : healed over chest Psych: affect appropriate, judgment/insight intact   Lab Findings: Lab Results  Component Value Date   WBC 3.9 05/24/2016   HGB 12.8 05/24/2016   HCT 37.5 05/24/2016   MCV 93.8 05/24/2016   PLT 258 05/24/2016    Radiographic Findings: Ct Chest W Contrast  Result Date: 07/20/2016 CLINICAL DATA:  Mediastinal large B-cell lymphoma. intrathoracic adenopathy. EXAM: CT CHEST WITH CONTRAST TECHNIQUE: Multidetector CT imaging  of the chest was performed during intravenous contrast administration. CONTRAST:  91mL ISOVUE-300 IOPAMIDOL (ISOVUE-300) INJECTION 61% COMPARISON:  02/21/2016 and 07/22/2015 FINDINGS: Cardiovascular: The heart size appears normal. No pericardial effusion. Mediastinum/Nodes: The trachea appears patent and is midline. Normal appearance of the esophagus. No  mediastinal or hilar adenopathy. Within the anterior mediastinum there is a partially calcified soft tissue attenuating nodule measuring 2.1 x 1.4 x 2.3 cm, image 43 of series 2. On the previous exam (07/22/2015) this measured 2.9 x 1.6 x 2.6 cm. Lungs/Pleura: No pleural fluid. Paramediastinal fibrotic changes are identified within both upper lobes compatible with changes of external beam radiation. 3 mm Lung nodule within the right upper lobe is identified not clearly identified on 07/22/2015 or 02/21/2016. Upper Abdomen: No acute abnormality. Musculoskeletal: No chest wall abnormality. No acute or significant osseous findings. IMPRESSION: 1. Partially calcified anterior mediastinal mass is decreased in size when compared with study from 07/22/2015 and 02/21/2016. 2. 3 mm nodule in the right upper lobe is identified. Not definitely seen on previous exam which may reflect differences in technique. Attention on follow-up imaging advise. 3. New paramediastinal fibrotic changes within both upper lobes compatible with changes of external beam radiation. Electronically Signed   By: Kerby Moors M.D.   On: 07/20/2016 08:50    Impression/Plan: The patient is well appearing and in no acute distress. There is no evidence of recurrent disease at this time.  Because of the patient's occasional chest pain, I offered to refer her to a cardiologist for peace of mind. We discussed that her chemotherapy could have  an effect on heart function in years to come, though I think this is unlikely to cause current issues.  Given the history of radiotherapy to the upper mediastinum/upper heart, she should also follow with cardiology as appropriate. The patient is in agreement, and I will refer her to these services.  I will reach out to Dr. Alvy Bimler to discuss the patient's next CT scan in 3-6 months. She will be tentatively scheduled for CT in 6 months but this can be moved up. She will follow up with me in 6  months.  _____________________________________   Eppie Gibson, MD  This document serves as a record of services personally performed by Eppie Gibson, MD. It was created on her behalf by Maryla Morrow, a trained medical scribe. The creation of this record is based on the scribe's personal observations and the provider's statements to them. This document has been checked and approved by the attending provider.

## 2016-07-20 NOTE — Progress Notes (Signed)
Katrina Aguilar presents for follow up of radiation to her Anterior Mediastinum completed 12/09/15. She reports aches to her right lateral side at times. She also has reports sharp pain to her Left Chest area about once a month with lasts for a short period of time, but will take her breath away since completing radiation. She is eating well. She reports things are going well at home. She denies shortness of breath except with exertion. She is here is to get results of her CT Chest from yesterday.  BP (!) 132/91   Pulse 81   Temp 98.2 F (36.8 C)   Wt 117 lb 6.4 oz (53.3 kg)   SpO2 100% Comment: room air  BMI 20.80 kg/m    Wt Readings from Last 3 Encounters:  07/20/16 117 lb 6.4 oz (53.3 kg)  05/24/16 114 lb 14.4 oz (52.1 kg)  03/30/16 114 lb (51.7 kg)

## 2016-07-23 ENCOUNTER — Other Ambulatory Visit: Payer: Self-pay | Admitting: Radiation Oncology

## 2016-07-23 DIAGNOSIS — C8522 Mediastinal (thymic) large B-cell lymphoma, intrathoracic lymph nodes: Secondary | ICD-10-CM

## 2016-07-25 ENCOUNTER — Other Ambulatory Visit: Payer: Self-pay | Admitting: Radiation Oncology

## 2016-07-25 DIAGNOSIS — C8522 Mediastinal (thymic) large B-cell lymphoma, intrathoracic lymph nodes: Secondary | ICD-10-CM

## 2016-07-26 ENCOUNTER — Telehealth: Payer: Self-pay | Admitting: *Deleted

## 2016-07-26 NOTE — Telephone Encounter (Signed)
CALLED PATIENT TO INFORM OF APPT. WITH DR. MARK SKAINS ON 08-14-16- ARRIVAL TIME - 7:45 AM, ADDRESS [place] SPOKE WITH PATIENT AND SHE IS AWARE OF THIS APPT.

## 2016-08-09 NOTE — Telephone Encounter (Signed)
error 

## 2016-08-14 ENCOUNTER — Ambulatory Visit: Payer: 59 | Admitting: Cardiology

## 2016-09-05 ENCOUNTER — Encounter: Payer: Self-pay | Admitting: Cardiology

## 2016-09-05 ENCOUNTER — Ambulatory Visit (INDEPENDENT_AMBULATORY_CARE_PROVIDER_SITE_OTHER): Payer: Self-pay | Admitting: Cardiology

## 2016-09-05 VITALS — BP 96/70 | HR 82 | Ht 62.0 in | Wt 130.0 lb

## 2016-09-05 DIAGNOSIS — R0789 Other chest pain: Secondary | ICD-10-CM

## 2016-09-05 DIAGNOSIS — C8522 Mediastinal (thymic) large B-cell lymphoma, intrathoracic lymph nodes: Secondary | ICD-10-CM

## 2016-09-05 NOTE — Progress Notes (Signed)
Cardiology Office Note    Date:  09/05/2016   ID:  Katrina Aguilar, DOB 11/19/1985, MRN [id number]  PCP:  [facility]  Cardiologist:   Candee Furbish, MD     History of Present Illness:  Katrina Aguilar is a 31 y.o. female here for evaluation of chest pain at the request of Susy Manor M.D. 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 During an office visit on 07/20/16 she was reporting chest achiness, right lateral at times and some sharp left chest pain over an extended period of time that sometimes takes her breath away. Since the treatments she can have the CP when relaxing. Like a cramp in her heart. Points above left breast. Last about 2 min. Going/coming. Can go many days without pain. No changes with exercise.   No prior heart history.   No tob, quit 2012 (6 years). No fam hx of CAD.   She has completed her full radiation strategy. No changes with diet. A CT scan was performed in January 2018 that showed an anterior mediastinal partially calcified tissue attenuating nodule of 2 x 2 cm. It is slightly decreased from prior exam. She has paramediastinal fibrotic changes.  She was offered this visit for peace of mind given her ongoing chest pain. It was discussed that her chemotherapy could have had an effect on her heart function. Given her history of radiotherapy as well to her upper mediastinum/upper heart region she is being seen.  Past Medical History:  Diagnosis Date  . 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 Chronic back pain   . Chronic nausea 02/14/2016  . Hx of radiation therapy 11/09/15- 12/09/15   Anterior Mediastinum   . Pain syndrome, chronic   . Spondylolysis of thoracic region   . Whiplash   . Wound dehiscence, surgical     Past Surgical History:  Procedure Laterality Date  .  BRONCHOSCOPY  05/16/2015  . CENTRAL VENOUS CATHETER TUNNELED INSERTION SINGLE LUMEN     Port Placement, removed Dec 2016  . Thoracoscopy, mediastinal space with biopsy  05/16/15    Current Medications: No outpatient prescriptions prior to visit.   No facility-administered medications prior to visit.      Allergies:   Patient has no known allergies.   Social History   Social History  . Marital status: Married    Spouse name: Isiah  . Number of children: 3  . Years of education: N/A   Occupational History  . home maker    Social History Main Topics  . Smoking status: Former Smoker    Packs/day: 0.50    Types: Cigarettes    Quit date: 06/26/2011  . Smokeless tobacco: Never Used  . Alcohol use 0.0 oz/week  . Drug use: No  . Sexual activity: Not Asked   Other Topics Concern  . None   Social History Narrative  . None     Family History:  The patient's family history includes Breast cancer in her paternal aunt; Emphysema in her sister; Fibromyalgia in her mother; Heart disease in her paternal aunt; Hodgkin's lymphoma in her cousin; Hypertension in her father and mother; Ovarian cancer in her paternal aunt; Scleroderma in her sister; Stomach cancer in her paternal uncle.   ROS:   Please see the history of present illness.    ROS All other systems  reviewed and are negative.   PHYSICAL EXAM:   VS:  BP 96/70   Pulse 82   Ht 5\' 2"  (1.575 m)   Wt 130 lb (59 kg)   BMI 23.78 kg/m    GEN: Well nourished, well developed, in no acute distress  HEENT: normal  Neck: no JVD, carotid bruits, or masses Cardiac: RRR; no murmurs, rubs, or gallops,no edema  Respiratory:  clear to auscultation bilaterally, normal work of breathing GI: soft, nontender, nondistended, + BS MS: no deformity or atrophy  Skin: warm and dry, no rash Neuro:  Alert and Oriented x 3, Strength and sensation are intact Psych: euthymic mood, full affect  Wt Readings from Last 3 Encounters:  09/05/16 130 lb  (59 kg)  07/20/16 117 lb 6.4 oz (53.3 kg)  05/24/16 114 lb 14.4 oz (52.1 kg)      Studies/Labs Reviewed:   EKG:  EKG is ordered today. 09/05/16-sinus rhythm heart rate 82 bpm with mild sinus arrhythmia, normal EKG. Personally viewed  Recent Labs: 05/24/2016: ALT 27; BUN 18.2; Creatinine 0.8; HGB 12.8; Platelets 258; Potassium 3.8; Sodium 140   Lipid Panel No results found for: CHOL, TRIG, HDL, CHOLHDL, VLDL, LDLCALC, LDLDIRECT  Additional studies/ records that were reviewed today include:   CT scan report, EKG, prior office note, lab work reviewed    ASSESSMENT:    1. Atypical chest pain   2. Mediastinal (thymic) large B-cell lymphoma of intrathoracic lymph nodes (HCC)      PLAN:  In order of problems listed above:  Atypical chest pain  - Pain does not sound cardiac in origin. EKG is reassuring. It is likely musculoskeletal or associated with mild inflammatory changes. It is not exertional. No associated worrisome symptoms such as syncope.  - I will check an echocardiogram to ensure proper structure and function of her heart and to exclude pericardial effusion. Hopefully no further cardiac workup will be necessary.  Thymic cancer  - It is being monitored closely by Dr. Isidore Moos and her team. I have reviewed her notes.      Medication Adjustments/Labs and Tests Ordered: Current medicines are reviewed at length with the patient today.  Concerns regarding medicines are outlined above.  Medication changes, Labs and Tests ordered today are listed in the Patient Instructions below. Patient Instructions  Medication Instructions:  The current medical regimen is effective;  continue present plan and medications.  Testing/Procedures: Your physician has requested that you have an echocardiogram. Echocardiography is a painless test that uses sound waves to create images of your heart. It provides your doctor with information about the size and shape of your heart and how well your  heart's chambers and valves are working. This procedure takes approximately one hour. There are no restrictions for this procedure.  Follow-Up: Follow up as needed after the above testing.  If you need a refill on your cardiac medications before your next appointment, please call your pharmacy.  Thank you for choosing [facility]!!        Signed, Candee Furbish, MD  09/05/2016 11:13 AM    [facility] [address], [place], [place]  [address] Phone: [phone]; Fax: [phone]

## 2016-09-05 NOTE — Patient Instructions (Signed)
Medication Instructions:  The current medical regimen is effective;  continue present plan and medications.  Testing/Procedures: Your physician has requested that you have an echocardiogram. Echocardiography is a painless test that uses sound waves to create images of your heart. It provides your doctor with information about the size and shape of your heart and how well your heart's chambers and valves are working. This procedure takes approximately one hour. There are no restrictions for this procedure.  Follow-Up: Follow up as needed after the above testing.  If you need a refill on your cardiac medications before your next appointment, please call your pharmacy.  Thank you for choosing [facility]!!     

## 2016-09-20 ENCOUNTER — Other Ambulatory Visit (HOSPITAL_COMMUNITY): Payer: Self-pay

## 2016-10-04 ENCOUNTER — Ambulatory Visit (HOSPITAL_COMMUNITY): Payer: Self-pay | Attending: Internal Medicine

## 2016-10-04 ENCOUNTER — Other Ambulatory Visit: Payer: Self-pay

## 2016-10-04 DIAGNOSIS — R0789 Other chest pain: Secondary | ICD-10-CM | POA: Insufficient documentation

## 2016-10-18 ENCOUNTER — Telehealth: Payer: Self-pay | Admitting: Hematology and Oncology

## 2016-10-18 ENCOUNTER — Ambulatory Visit (HOSPITAL_BASED_OUTPATIENT_CLINIC_OR_DEPARTMENT_OTHER): Payer: Self-pay | Admitting: Hematology and Oncology

## 2016-10-18 ENCOUNTER — Encounter: Payer: Self-pay | Admitting: Hematology and Oncology

## 2016-10-18 ENCOUNTER — Other Ambulatory Visit (HOSPITAL_BASED_OUTPATIENT_CLINIC_OR_DEPARTMENT_OTHER): Payer: Self-pay

## 2016-10-18 DIAGNOSIS — C8522 Mediastinal (thymic) large B-cell lymphoma, intrathoracic lymph nodes: Secondary | ICD-10-CM

## 2016-10-18 LAB — CBC WITH DIFFERENTIAL/PLATELET
BASO%: 0.5 % (ref 0.0–2.0)
Basophils Absolute: 0 10*3/uL (ref 0.0–0.1)
EOS%: 0.5 % (ref 0.0–7.0)
Eosinophils Absolute: 0 10*3/uL (ref 0.0–0.5)
HCT: 39.1 % (ref 34.8–46.6)
HGB: 13.2 g/dL (ref 11.6–15.9)
LYMPH%: 12.8 % — AB (ref 14.0–49.7)
MCH: 32.1 pg (ref 25.1–34.0)
MCHC: 33.8 g/dL (ref 31.5–36.0)
MCV: 94.9 fL (ref 79.5–101.0)
MONO#: 0.3 10*3/uL (ref 0.1–0.9)
MONO%: 6.5 % (ref 0.0–14.0)
NEUT#: 4.1 10*3/uL (ref 1.5–6.5)
NEUT%: 79.7 % — AB (ref 38.4–76.8)
Platelets: 273 10*3/uL (ref 145–400)
RBC: 4.12 10*6/uL (ref 3.70–5.45)
RDW: 13 % (ref 11.2–14.5)
WBC: 5.1 10*3/uL (ref 3.9–10.3)
lymph#: 0.7 10*3/uL — ABNORMAL LOW (ref 0.9–3.3)

## 2016-10-18 LAB — COMPREHENSIVE METABOLIC PANEL
ALBUMIN: 4.8 g/dL (ref 3.5–5.0)
ALK PHOS: 50 U/L (ref 40–150)
ALT: 23 U/L (ref 0–55)
AST: 23 U/L (ref 5–34)
Anion Gap: 11 mEq/L (ref 3–11)
BILIRUBIN TOTAL: 0.68 mg/dL (ref 0.20–1.20)
BUN: 13 mg/dL (ref 7.0–26.0)
CO2: 24 mEq/L (ref 22–29)
Calcium: 10.4 mg/dL (ref 8.4–10.4)
Chloride: 106 mEq/L (ref 98–109)
Creatinine: 0.9 mg/dL (ref 0.6–1.1)
EGFR: >90 mL/min/{1.73_m2} (ref >90)
Glucose: 84 mg/dl (ref 70–140)
Potassium: 3.6 mEq/L (ref 3.5–5.1)
Sodium: 141 mEq/L (ref 136–145)
TOTAL PROTEIN: 8.2 g/dL (ref 6.4–8.3)

## 2016-10-18 LAB — LACTATE DEHYDROGENASE: LDH: 140 U/L (ref 125–245)

## 2016-10-18 NOTE — Assessment & Plan Note (Signed)
The patient has completed 5 cycles of chemotherapy and radiation therapy. Clinically, she has no signs of disease recurrence. She is following with  radiation oncologist in July and she has ordered a CT imaging I will see her back in 6 months in October with history, physical examination and blood work only

## 2016-10-18 NOTE — Telephone Encounter (Signed)
Gave patient AVS and calender per 4/26 los.  

## 2016-10-18 NOTE — Progress Notes (Signed)
[place] OFFICE PROGRESS NOTE  Patient Care Team: [facility] Medicine @ [facility] Services as PCP - General (Family Medicine)  SUMMARY OF ONCOLOGIC HISTORY:   Mediastinal (thymic) large B-cell lymphoma ([place])   07/07/2014 Imaging    CT at [facility] showed nonspecific prevascular masslike density 2.1 x 3.9 cm. Given the appearance and patient's age, this may represent an unusually prominent hyperplastic thymus, versus thymoma, lymphoma, etc. Assuming prior exams are not available to confirm stability, recommend chest MR with and without contrast including in and out of phase imaging for further evaluation      09/14/2014 Imaging    MR chest at [facility] showed partially solid, partially cystic anterior mediastinal mass. The cystic component is new compared to the prior study 07/07/2014 and has mural nodularity. This may represent internal hemorrhage. The cystic component mural nodularity. The chemical shift ratio does not favor benign thymic hyperplasia. The differential is led by cystic thymoma. Lymphoma and germ cell tumor are also considerations, but felt less likely. If tissue diagnosis is not obtained, recommend 3 month follow-up thoracic MRI with contrast material.      12/21/2014 Imaging    MR chest at [facility] showed a soft tissue mass within the prevascular space of the anterior mediastinum. Note that this examination is limited due to suboptimal technique and the lack of intravenous contrast. Within this context, this lesion is stable to perhaps slightly decreased in size from the comparison study.The previously seen cystic component of this lesion is no longer as well identified, however this is most likely due to suboptimal technique.      04/10/2015 Imaging    CT chest no evidence of pulmonary embolus or right heart strain. Prominent central pulmonary artery trunk with no evidence of central pulmonary artery enlargement. Large anterior and right-sided mediastinal mass.  Differential diagnosis includes thymoma, teratoma, lymphoma, and less likely extra medullary hematopoiesis Small right greater than left pleural effusions and peribronchial streaky opacities bilateral lower lobes subsegmental atelectasis or infiltrate      04/27/2015 Imaging    MR chest showed enlarging bilobed enhancing anterior mediastinal mass with central necrosis within the right-sided component. Broad abutment of the pericardium is concerning for possible invasion. There is possible encasement of the right mammary vessels. No definite evidence of vascular invasion of the SVC, left brachiocephalic vein, or mammary vessels despite the close proximity of the mass. Based on the patient's age, this remains concerning for germ cell tumor, thymoma, or lymphoma. Consider further evaluation with direct tissue sampling and germ cell tumor markers.      05/04/2015 PET scan    PET scan at [facility] showed hypermetabolic bilobed anterior mediastinal mass with central necrosis in the right sided component is concerning for malignancy with differential including germ cell tumor, thymoma/thymic carcinoma, or lymphoma. There is no evidence of FDG avid disease elsewhere in the chest, abdomen, or pelvis.      05/16/2015 Pathology Results    Mediastinal LN biopsy: The sections show a diffuse proliferation of medium to large-sized lymphoid cells with round and irregular nuclear contours and abundant eosinophilic to clear cytoplasm. Occasional multi-lobated cells are seen. Scattered mitotic figures and apoptotic bodies are present. There are thin bands of compartmentalizing fibrosis within the infiltrate in addition to thick bands of collagen fibrosis. After the initial morphologic evaluation, immunohistochemical stains are performed on block C1 (control tissue reacts properly). The abnormal lymphoid cells are B-cells, which stain with CD20, PAX-5 and CD79a. The abnormal cells are positive for CD23  and MUM-1 and are negative  for CD10. CD3 and CD5 highlight many intermixed small T-cells. Approximately 10-20% of the cells express BCL-6, while the majority appear negative for BCL-2. The Ki-67 proliferation index is approximately 30-40%. The cytokeratin AE1/AE3 stain highlights background mesothelial cells. CD30 shows patchy, weak staining of the abnormal lymphoid cells, while CD15 appears to highlight granulocytic cells.           05/25/2015 Initial Diagnosis    She was seen at [facility]      05/30/2015 Imaging    Outside [facility] showed normal left ventricular wall motion. The left ventricular ejection fraction is 66.2%.      06/01/2015 Bone Marrow Biopsy    Outside bone marrow biopsy was negative      06/13/2015 - 09/14/2015 Chemotherapy    The patient had 5 cycles of RCHOP chemotherapy treatment at [facility]        07/22/2015 Imaging    CT chest showed residual solid anterior mediastinal mass, decreased in size from recent PET/CT. Residual or recurrent tumor is not excluded.      08/12/2015 PET scan    Decreased metabolic activity of the anterior mediastinal lymph nodes. No new sites of hypermetabolic disease. (De[place] 3). Mild radiotracer uptake in the right upper anterior chest wall skin, which is likely related to focus of inflammation or sequelae of trauma.      09/29/2015 PET scan    Interval slight decrease in FDG activity in the dominant anterior mediastinal mass. A smaller adjacent mediastinal mass is unchanged from prior study. No evidence of new FDG avid disease (Deauville 3). Interval decrease in FDG activity in the superficial tissues of the right anterior chest wall, favored to represent inflammatory change.      11/08/2015 - 12/09/2015 Radiation Therapy    She received radiation treatment to anterior Mediastinum treated to 41.40 Gy in 23 fractions      02/21/2016 PET scan    Since the PET of 09/28/2015, decreased size of an anterior mediastinal mass with unchanged hypermetabolism, similar to the  mediastinal pool. (Deauville 2). No this new sites of disease identified      07/20/2016 Imaging    CT chest showed partially calcified anterior mediastinal mass is decreased in size when compared with study from 07/22/2015 and 02/21/2016. 2. 3 mm nodule in the right upper lobe is identified. Not definitely seen on previous exam which may reflect differences in technique. Attention on follow-up imaging advise. 3. New paramediastinal fibrotic changes within both upper lobes compatible with changes of external beam radiation.       INTERVAL HISTORY: Please see below for problem oriented charting. She is seen today together with her 2 children. She was evaluated by cardiologist recently for atypical chest pain, resolved She denies recent infection No swallowing difficulties No new lumps or bumps, or new lymphadenopathy. Appetite is stable, no recent weight loss  REVIEW OF SYSTEMS:   Constitutional: Denies fevers, chills or abnormal weight loss Eyes: Denies blurriness of vision Ears, nose, mouth, throat, and face: Denies mucositis or sore throat Respiratory: Denies cough, dyspnea or wheezes Cardiovascular: Denies palpitation, chest discomfort or lower extremity swelling Gastrointestinal:  Denies nausea, heartburn or change in bowel habits Skin: Denies abnormal skin rashes Lymphatics: Denies new lymphadenopathy or easy bruising Neurological:Denies numbness, tingling or new weaknesses Behavioral/Psych: Mood is stable, no new changes  All other systems were reviewed with the patient and are negative.  I have reviewed the past medical history, past surgical history, social history and  family history with the patient and they are unchanged from previous note.  ALLERGIES:  has No Known Allergies.  MEDICATIONS:  Current Outpatient Prescriptions  Medication Sig Dispense Refill  . Cholecalciferol (VITAMIN D) 2000 units CAPS Take 2,000 Units by mouth daily.     No current  facility-administered medications for this visit.     PHYSICAL EXAMINATION: ECOG PERFORMANCE STATUS: 0 - Asymptomatic  Vitals:   10/18/16 1257  BP: 111/78  Pulse: 91  Resp: 20  Temp: 98.2 F (36.8 C)   Filed Weights   10/18/16 1257  Weight: 120 lb 1.6 oz (54.5 kg)    GENERAL:alert, no distress and comfortable SKIN: skin color, texture, turgor are normal, no rashes or significant lesions EYES: normal, Conjunctiva are pink and non-injected, sclera clear OROPHARYNX:no exudate, no erythema and lips, buccal mucosa, and tongue normal  NECK: supple, thyroid normal size, non-tender, without nodularity LYMPH:  no palpable lymphadenopathy in the cervical, axillary or inguinal LUNGS: clear to auscultation and percussion with normal breathing effort HEART: regular rate & rhythm and no murmurs and no lower extremity edema ABDOMEN:abdomen soft, non-tender and normal bowel sounds Musculoskeletal:no cyanosis of digits and no clubbing  NEURO: alert & oriented x 3 with fluent speech, no focal motor/sensory deficits  LABORATORY DATA:  I have reviewed the data as listed    Component Value Date/Time   NA 141 10/18/2016 1246   K 3.6 10/18/2016 1246   CL 106 04/22/2015 1353   CO2 24 10/18/2016 1246   GLUCOSE 84 10/18/2016 1246   BUN 13.0 10/18/2016 1246   CREATININE 0.9 10/18/2016 1246   CALCIUM 10.4 10/18/2016 1246   PROT 8.2 10/18/2016 1246   ALBUMIN 4.8 10/18/2016 1246   AST 23 10/18/2016 1246   ALT 23 10/18/2016 1246   ALKPHOS 50 10/18/2016 1246   BILITOT 0.68 10/18/2016 1246   GFRNONAA >60 04/22/2015 1353   GFRAA >60 04/22/2015 1353    No results found for: SPEP, UPEP  Lab Results  Component Value Date   WBC 5.1 10/18/2016   NEUTROABS 4.1 10/18/2016   HGB 13.2 10/18/2016   HCT 39.1 10/18/2016   MCV 94.9 10/18/2016   PLT 273 10/18/2016      Chemistry      Component Value Date/Time   NA 141 10/18/2016 1246   K 3.6 10/18/2016 1246   CL 106 04/22/2015 1353   CO2 24  10/18/2016 1246   BUN 13.0 10/18/2016 1246   CREATININE 0.9 10/18/2016 1246      Component Value Date/Time   CALCIUM 10.4 10/18/2016 1246   ALKPHOS 50 10/18/2016 1246   AST 23 10/18/2016 1246   ALT 23 10/18/2016 1246   BILITOT 0.68 10/18/2016 1246       RADIOGRAPHIC STUDIES: I reviewed recent CT imaging with the patient I have personally reviewed the radiological images as listed and agreed with the findings in the report.   ASSESSMENT & PLAN:  Mediastinal (thymic) large B-cell lymphoma ([place]) The patient has completed 5 cycles of chemotherapy and radiation therapy. Clinically, she has no signs of disease recurrence. She is following with  radiation oncologist in July and she has ordered a CT imaging I will see her back in 6 months in October with history, physical examination and blood work only   No orders of the defined types were placed in this encounter.  All questions were answered. The patient knows to call the clinic with any problems, questions or concerns. No barriers to learning was detected. I  spent 10 minutes counseling the patient face to face. The total time spent in the appointment was 15 minutes and more than 50% was on counseling and review of test results     Heath Lark, MD 10/18/2016 1:45 PM

## 2017-01-17 ENCOUNTER — Telehealth: Payer: Self-pay | Admitting: *Deleted

## 2017-01-17 NOTE — Telephone Encounter (Signed)
CALLED PATIENT TO INFORM OF STAT LABS AND CT FOR 01-22-17 @ [place] AND [facility] AND HER FU VISIT WITH DR. Isidore Moos ON 01-23-17, LVM FOR A RETURN CALL

## 2017-01-18 ENCOUNTER — Telehealth: Payer: Self-pay | Admitting: *Deleted

## 2017-01-18 ENCOUNTER — Ambulatory Visit: Admission: RE | Admit: 2017-01-18 | Payer: 59 | Source: Ambulatory Visit | Admitting: Radiation Oncology

## 2017-01-18 NOTE — Telephone Encounter (Signed)
CALLED PATIENT TO INFORM THAT LAB, CT AND FU HAVE BEEN MOVED TO NEW DATE OF 01-31-17 FOR LAB AND CT AND FU FOR 02-01-17, SPOKE WITH PATIENT AND SHE IS AWARE OF THESE APPTS. AND IS GOOD WITH THESE APPTS.

## 2017-01-22 ENCOUNTER — Ambulatory Visit (HOSPITAL_COMMUNITY): Admission: RE | Admit: 2017-01-22 | Payer: Self-pay | Source: Ambulatory Visit

## 2017-01-22 ENCOUNTER — Ambulatory Visit: Payer: 59

## 2017-01-23 ENCOUNTER — Ambulatory Visit: Payer: 59 | Admitting: Radiation Oncology

## 2017-01-31 ENCOUNTER — Ambulatory Visit (HOSPITAL_COMMUNITY): Payer: Self-pay

## 2017-01-31 ENCOUNTER — Ambulatory Visit: Payer: Self-pay

## 2017-02-01 ENCOUNTER — Ambulatory Visit: Admission: RE | Admit: 2017-02-01 | Payer: Self-pay | Source: Ambulatory Visit | Admitting: Radiation Oncology

## 2017-02-19 ENCOUNTER — Ambulatory Visit
Admission: RE | Admit: 2017-02-19 | Discharge: 2017-02-19 | Disposition: A | Discharge status: Home / Self Care | Payer: Self-pay | Source: Ambulatory Visit | Attending: Radiation Oncology | Admitting: Radiation Oncology

## 2017-02-19 ENCOUNTER — Encounter (HOSPITAL_COMMUNITY): Payer: Self-pay

## 2017-02-19 ENCOUNTER — Ambulatory Visit (HOSPITAL_COMMUNITY)
Admission: RE | Admit: 2017-02-19 | Discharge: 2017-02-19 | Disposition: A | Discharge status: Home / Self Care | Payer: Medicaid Other | Source: Ambulatory Visit | Attending: Radiation Oncology | Admitting: Radiation Oncology

## 2017-02-19 DIAGNOSIS — C8522 Mediastinal (thymic) large B-cell lymphoma, intrathoracic lymph nodes: Secondary | ICD-10-CM | POA: Insufficient documentation

## 2017-02-19 LAB — BUN AND CREATININE (CC13)
BUN: 8.1 mg/dL (ref 7.0–26.0)
Creatinine: 0.8 mg/dL (ref 0.6–1.1)
EGFR: >90 mL/min/{1.73_m2} (ref >90)

## 2017-02-19 MED ORDER — IOPAMIDOL (ISOVUE-300) INJECTION 61%
75.0000 mL | Freq: Once | INTRAVENOUS | Status: AC | PRN
  Administered 2017-02-19: 75 mL via INTRAVENOUS

## 2017-02-19 MED ORDER — IOPAMIDOL (ISOVUE-300) INJECTION 61%
INTRAVENOUS | Status: AC
  Filled 2017-02-19: qty 75

## 2017-02-20 ENCOUNTER — Ambulatory Visit
Admission: RE | Admit: 2017-02-20 | Discharge: 2017-02-20 | Disposition: A | Discharge status: Home / Self Care | Payer: Medicaid Other | Source: Ambulatory Visit | Attending: Radiation Oncology | Admitting: Radiation Oncology

## 2017-02-20 ENCOUNTER — Encounter: Payer: Self-pay | Admitting: Radiation Oncology

## 2017-02-20 ENCOUNTER — Ambulatory Visit: Payer: Medicaid Other

## 2017-02-20 DIAGNOSIS — C8522 Mediastinal (thymic) large B-cell lymphoma, intrathoracic lymph nodes: Secondary | ICD-10-CM

## 2017-02-20 DIAGNOSIS — Z9221 Personal history of antineoplastic chemotherapy: Secondary | ICD-10-CM | POA: Insufficient documentation

## 2017-02-20 DIAGNOSIS — Z08 Encounter for follow-up examination after completed treatment for malignant neoplasm: Secondary | ICD-10-CM | POA: Insufficient documentation

## 2017-02-20 DIAGNOSIS — Z923 Personal history of irradiation: Secondary | ICD-10-CM | POA: Insufficient documentation

## 2017-02-20 DIAGNOSIS — Z8572 Personal history of non-Hodgkin lymphomas: Secondary | ICD-10-CM | POA: Insufficient documentation

## 2017-02-20 DIAGNOSIS — R079 Chest pain, unspecified: Secondary | ICD-10-CM | POA: Insufficient documentation

## 2017-02-20 DIAGNOSIS — R0602 Shortness of breath: Secondary | ICD-10-CM | POA: Insufficient documentation

## 2017-02-20 NOTE — Progress Notes (Addendum)
Radiation Oncology         [phone] ________________________________  Name: Katrina Aguilar MRN: [id number]  Date: 02/20/2017  DOB: 1986/05/07  Follow-Up Visit Note  Outpatient  CC: [facility] @ [address] [facility]  Diagnosis and Prior Radiotherapy:    ICD-10-CM   1. 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  The patient had a chest CT performed yesterday which showed persistent partially calcified soft tissue mas in the anterior mediastinum, compatible with treated lymphoma. No new lymphadenopathy or acute findings noted.  The patient did see cardiology last March for chest pain based on my referral. An echocardiogram was performed on 10/04/2016. Her ejection fraction was 55-60%. This was a normal study. The patient continues following up reguarly with Dr. Alvy Bimler.  On review of systems, the patient reports intermittent central sternal pain at times when she has significant anxiety. She also experiences shortness of breath with exertion. 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   ALLERGIES:  has No Known Allergies.  Meds: Current Outpatient Prescriptions  Medication Sig Dispense Refill  . Cholecalciferol (VITAMIN D) 2000 units CAPS Take 2,000 Units by mouth daily.     No current facility-administered medications for this encounter.    Review of Systems:as above  Physical Findings:  height is 5\' 2"  (1.575 m) and weight is 119 lb 9.6 oz (54.3 kg). Her temperature is 99.3 F (37.4 C). Her blood pressure  is 116/87 and her pulse is 96. Her oxygen saturation is 100%.   Wt Readings from Last 3 Encounters:  02/20/17 119 lb 9.6 oz (54.3 kg)  10/18/16 120 lb 1.6 oz (54.5 kg)  09/05/16 130 lb (59 kg)   General: Alert and oriented, in no acute distress. HEENT: Head is normocephalic. Extraocular movements are intact. Oropharynx is clear. Neck: Neck is supple, no palpable cervical or supraclavicular lymphadenopathy. Heart: Regular in rate and rhythm with no murmurs, rubs, or gallops. Chest: Clear to auscultation bilaterally, with no rhonchi, wheezes, or rales. Abdomen: Soft, nontender, nondistended, with no rigidity or guarding. Extremities: No cyanosis or edema. Lymphatics: see Neck Exam Psychiatric: Judgment and insight are intact. Affect is appropriate.    Lab Findings: Lab Results  Component Value Date   WBC 5.1 10/18/2016   HGB 13.2 10/18/2016   HCT 39.1 10/18/2016   MCV 94.9 10/18/2016   PLT 273 10/18/2016    Radiographic Findings: Ct Chest W Contrast  Result Date: 02/19/2017 CLINICAL DATA:  31 year old female with history of large B-cell lymphoma originally diagnosed in November 2016 status post chemotherapy and radiation therapy which is now complete. Intermittent chest pain for the past 2 months. EXAM: CT CHEST WITH CONTRAST TECHNIQUE: Multidetector CT imaging of the chest was performed during intravenous contrast administration. CONTRAST:  2mL ISOVUE-300 IOPAMIDOL (ISOVUE-300) INJECTION 61% COMPARISON:  Chest CT 07/19/2016. FINDINGS: Cardiovascular: Heart size is normal. There is no significant pericardial fluid, thickening or pericardial calcification. No significant atherosclerotic disease in the thoracic aorta or great vessels of the mediastinum. No coronary artery calcifications are identified. Mediastinum/Nodes: Partially calcified 1.5 x 1.9 cm soft tissue mass in  the anterior mediastinum is stable compared to the prior examination. No other mediastinal or hilar lymphadenopathy  is noted on today's examination. Esophagus is unremarkable in appearance. No axillary lymphadenopathy. Lungs/Pleura: Areas of mild architectural distortion with septal thickening are noted in the medial aspects of the upper lobes of lungs bilaterally, related to prior radiation therapy. No suspicious appearing pulmonary nodules or masses. No acute consolidative airspace disease. No pleural effusions. Upper Abdomen: Unremarkable. Musculoskeletal: There are no aggressive appearing lytic or blastic lesions noted in the visualized portions of the skeleton. IMPRESSION: 1. Stable examination demonstrating persistent partially calcified soft tissue mass in the anterior mediastinum which is compatible with treated lymphoma. No new lymphadenopathy noted elsewhere in the thorax. No acute findings. Electronically Signed   By: Vinnie Langton M.D.   On: 02/19/2017 15:57    Impression/Plan: The patient is well appearing and in no acute distress. There is no evidence of recurrent disease at this time.  The patient's intermittent chest pain is likely related to anxiety. For this, I offered to refer her to a Education officer, museum and/or counseling therapist.  She thinks this would be helpful.  Cardiology evaluation was reassuring.  I will reach out to Dr. Alvy Bimler to inform her of the stable calcified mass which is consistent with scar tissue and defer to her on future imaging as clinically indicated - may consider infrequent CT scans of chest for surveillance.  She will follow up with me as needed.   _____________________________________   Eppie Gibson, MD  This document serves as a record of services personally performed by Eppie Gibson, MD. It was created on her behalf by Rae Lips, a trained medical scribe. The creation of this record is based on the scribe's personal observations and the provider's statements to them. This document has been checked and approved by the attending provider.

## 2017-02-20 NOTE — Progress Notes (Signed)
Katrina Aguilar presents for follow up of radiation completed 12/09/15 to her Mediastinum. She denies pain currently. She does report pain to her Chest (tumor area) and her Right upper arm several weeks ago. The pain did not improve with Tylenol. She tells me that she has not felt the pain in one week. She reports shortness of breath with exertion. She denies cough. She had a CT Chest 02/19/17 and is here for results. She will see Dr. Alvy Bimler 04/08/17 for follow up.   BP 116/87   Pulse 96   Temp 99.3 F (37.4 C)   Ht 5\' 2"  (1.575 m)   Wt 119 lb 9.6 oz (54.3 kg)   SpO2 100% Comment: room air  BMI 21.88 kg/m    Wt Readings from Last 3 Encounters:  02/20/17 119 lb 9.6 oz (54.3 kg)  10/18/16 120 lb 1.6 oz (54.5 kg)  09/05/16 130 lb (59 kg)

## 2017-02-21 ENCOUNTER — Telehealth: Payer: Self-pay | Admitting: *Deleted

## 2017-02-21 ENCOUNTER — Other Ambulatory Visit: Payer: Self-pay | Admitting: Radiation Oncology

## 2017-02-21 DIAGNOSIS — C8522 Mediastinal (thymic) large B-cell lymphoma, intrathoracic lymph nodes: Secondary | ICD-10-CM

## 2017-02-21 NOTE — Telephone Encounter (Signed)
CALLED GRIER HOCK TO ARRANGE SOCIAL WORK APPT. FOR THIS PT. SPOKE WITH GRIER AND SHE WILL TAKE CARE OF THIS

## 2017-04-02 ENCOUNTER — Other Ambulatory Visit: Payer: Self-pay | Admitting: Hematology and Oncology

## 2017-04-02 DIAGNOSIS — C8522 Mediastinal (thymic) large B-cell lymphoma, intrathoracic lymph nodes: Secondary | ICD-10-CM

## 2017-04-08 ENCOUNTER — Telehealth: Payer: Self-pay | Admitting: *Deleted

## 2017-04-08 ENCOUNTER — Ambulatory Visit (HOSPITAL_BASED_OUTPATIENT_CLINIC_OR_DEPARTMENT_OTHER): Payer: Self-pay | Admitting: Hematology and Oncology

## 2017-04-08 ENCOUNTER — Encounter: Payer: Self-pay | Admitting: Hematology and Oncology

## 2017-04-08 ENCOUNTER — Telehealth: Payer: Self-pay

## 2017-04-08 ENCOUNTER — Other Ambulatory Visit (HOSPITAL_BASED_OUTPATIENT_CLINIC_OR_DEPARTMENT_OTHER): Payer: Self-pay

## 2017-04-08 VITALS — BP 134/101 | HR 105 | Temp 98.6°F | Resp 17 | Ht 62.0 in | Wt 118.7 lb

## 2017-04-08 DIAGNOSIS — E876 Hypokalemia: Secondary | ICD-10-CM

## 2017-04-08 DIAGNOSIS — C8522 Mediastinal (thymic) large B-cell lymphoma, intrathoracic lymph nodes: Secondary | ICD-10-CM

## 2017-04-08 DIAGNOSIS — R079 Chest pain, unspecified: Secondary | ICD-10-CM

## 2017-04-08 DIAGNOSIS — R5383 Other fatigue: Secondary | ICD-10-CM | POA: Insufficient documentation

## 2017-04-08 DIAGNOSIS — Z23 Encounter for immunization: Secondary | ICD-10-CM

## 2017-04-08 LAB — CBC WITH DIFFERENTIAL/PLATELET
BASO%: 0.4 % (ref 0.0–2.0)
Basophils Absolute: 0 10*3/uL (ref 0.0–0.1)
EOS%: 2 % (ref 0.0–7.0)
Eosinophils Absolute: 0.1 10*3/uL (ref 0.0–0.5)
HEMATOCRIT: 38.8 % (ref 34.8–46.6)
HGB: 13.3 g/dL (ref 11.6–15.9)
LYMPH#: 0.8 10*3/uL — AB (ref 0.9–3.3)
LYMPH%: 15.3 % (ref 14.0–49.7)
MCH: 32.4 pg (ref 25.1–34.0)
MCHC: 34.3 g/dL (ref 31.5–36.0)
MCV: 94.6 fL (ref 79.5–101.0)
MONO#: 0.2 10*3/uL (ref 0.1–0.9)
MONO%: 3.5 % (ref 0.0–14.0)
NEUT%: 78.8 % — ABNORMAL HIGH (ref 38.4–76.8)
NEUTROS ABS: 4.3 10*3/uL (ref 1.5–6.5)
PLATELETS: 220 10*3/uL (ref 145–400)
RBC: 4.1 10*6/uL (ref 3.70–5.45)
RDW: 13.3 % (ref 11.2–14.5)
WBC: 5.4 10*3/uL (ref 3.9–10.3)

## 2017-04-08 LAB — COMPREHENSIVE METABOLIC PANEL
ALBUMIN: 4.4 g/dL (ref 3.5–5.0)
ALK PHOS: 71 U/L (ref 40–150)
ALT: 19 U/L (ref 0–55)
AST: 21 U/L (ref 5–34)
Anion Gap: 9 mEq/L (ref 3–11)
BUN: 9.4 mg/dL (ref 7.0–26.0)
CALCIUM: 9.8 mg/dL (ref 8.4–10.4)
CO2: 25 mEq/L (ref 22–29)
Chloride: 105 mEq/L (ref 98–109)
Creatinine: 0.9 mg/dL (ref 0.6–1.1)
Glucose: 55 mg/dl — ABNORMAL LOW (ref 70–140)
POTASSIUM: 3.1 meq/L — AB (ref 3.5–5.1)
Sodium: 139 mEq/L (ref 136–145)
Total Bilirubin: 0.61 mg/dL (ref 0.20–1.20)
Total Protein: 7.8 g/dL (ref 6.4–8.3)

## 2017-04-08 LAB — LACTATE DEHYDROGENASE: LDH: 130 U/L (ref 125–245)

## 2017-04-08 MED ORDER — INFLUENZA VAC SPLIT QUAD 0.5 ML IM SUSY
0.5000 mL | PREFILLED_SYRINGE | Freq: Once | INTRAMUSCULAR | Status: AC
  Administered 2017-04-08: 0.5 mL via INTRAMUSCULAR
  Filled 2017-04-08: qty 0.5

## 2017-04-08 NOTE — Telephone Encounter (Signed)
-----   Message from Heath Lark, MD sent at 04/08/2017  9:54 AM EDT ----- Regarding: potassium low Not sure why it is low? Did she has recent diarrhea? Recommend potasium rich diet ----- Message ----- From: Interface, Lab In Three Zero One Sent: 04/08/2017   9:26 AM To: Heath Lark, MD

## 2017-04-08 NOTE — Assessment & Plan Note (Signed)
The cause of low potassium is unknown It could be related to poor oral intake I recommend potassium rich diet

## 2017-04-08 NOTE — Telephone Encounter (Signed)
Notified of message below. No diarrhea. Will increase potassium in foods

## 2017-04-08 NOTE — Assessment & Plan Note (Signed)
The patient has completed 5 cycles of chemotherapy and radiation therapy. Clinically, she has no signs of disease recurrence. Plan to repeat imaging study when I see her back. I will order blood work and imaging study in February We discussed the importance of preventive care and reviewed the vaccination programs. She does not have any prior allergic reactions to influenza vaccination. She agrees to proceed with influenza vaccination today and we will administer it today at the clinic.

## 2017-04-08 NOTE — Progress Notes (Signed)
Katrina Aguilar OFFICE PROGRESS NOTE  Patient Care Team: [facility] [place] as PCP - General (Family Medicine)  SUMMARY OF ONCOLOGIC HISTORY:   Mediastinal (thymic) large B-cell lymphoma ([place])   07/07/2014 Imaging    CT at [facility] showed nonspecific prevascular masslike density 2.1 x 3.9 cm. Given the appearance and patient's age, this may represent an unusually prominent hyperplastic thymus, versus thymoma, lymphoma, etc. Assuming prior exams are not available to confirm stability, recommend chest MR with and without contrast including in and out of phase imaging for further evaluation      09/14/2014 Imaging    MR chest at [facility] showed partially solid, partially cystic anterior mediastinal mass. The cystic component is new compared to the prior study 07/07/2014 and has mural nodularity. This may represent internal hemorrhage. The cystic component mural nodularity. The chemical shift ratio does not favor benign thymic hyperplasia. The differential is led by cystic thymoma. Lymphoma and germ cell tumor are also considerations, but felt less likely. If tissue diagnosis is not obtained, recommend 3 month follow-up thoracic MRI with contrast material.      12/21/2014 Imaging    MR chest at [facility] showed a soft tissue mass within the prevascular space of the anterior mediastinum. Note that this examination is limited due to suboptimal technique and the lack of intravenous contrast. Within this context, this lesion is stable to perhaps slightly decreased in size from the comparison study.The previously seen cystic component of this lesion is no longer as well identified, however this is most likely due to suboptimal technique.      04/10/2015 Imaging    CT chest no evidence of pulmonary embolus or right heart strain. Prominent central pulmonary artery trunk with no evidence of central pulmonary artery enlargement. Large anterior and right-sided mediastinal mass.  Differential diagnosis includes thymoma, teratoma, lymphoma, and less likely extra medullary hematopoiesis Small right greater than left pleural effusions and peribronchial streaky opacities bilateral lower lobes subsegmental atelectasis or infiltrate      04/27/2015 Imaging    MR chest showed enlarging bilobed enhancing anterior mediastinal mass with central necrosis within the right-sided component. Broad abutment of the pericardium is concerning for possible invasion. There is possible encasement of the right mammary vessels. No definite evidence of vascular invasion of the SVC, left brachiocephalic vein, or mammary vessels despite the close proximity of the mass. Based on the patient's age, this remains concerning for germ cell tumor, thymoma, or lymphoma. Consider further evaluation with direct tissue sampling and germ cell tumor markers.      05/04/2015 PET scan    PET scan at [facility] showed hypermetabolic bilobed anterior mediastinal mass with central necrosis in the right sided component is concerning for malignancy with differential including germ cell tumor, thymoma/thymic carcinoma, or lymphoma. There is no evidence of FDG avid disease elsewhere in the chest, abdomen, or pelvis.      05/16/2015 Pathology Results    Mediastinal LN biopsy: The sections show a diffuse proliferation of medium to large-sized lymphoid cells with round and irregular nuclear contours and abundant eosinophilic to clear cytoplasm. Occasional multi-lobated cells are seen. Scattered mitotic figures and apoptotic bodies are present. There are thin bands of compartmentalizing fibrosis within the infiltrate in addition to thick bands of collagen fibrosis. After the initial morphologic evaluation, immunohistochemical stains are performed on block C1 (control tissue reacts properly). The abnormal lymphoid cells are B-cells, which stain with CD20, PAX-5 and CD79a. The abnormal cells are positive for CD23  and MUM-1 and are negative  for CD10. CD3 and CD5 highlight many intermixed small T-cells. Approximately 10-20% of the cells express BCL-6, while the majority appear negative for BCL-2. The Ki-67 proliferation index is approximately 30-40%. The cytokeratin AE1/AE3 stain highlights background mesothelial cells. CD30 shows patchy, weak staining of the abnormal lymphoid cells, while CD15 appears to highlight granulocytic cells.           05/25/2015 Initial Diagnosis    She was seen at [facility]      05/30/2015 Imaging    Outside [facility] showed normal left ventricular wall motion. The left ventricular ejection fraction is 66.2%.      06/01/2015 Bone Marrow Biopsy    Outside bone marrow biopsy was negative      06/13/2015 - 09/14/2015 Chemotherapy    The patient had 5 cycles of RCHOP chemotherapy treatment at [facility]        07/22/2015 Imaging    CT chest showed residual solid anterior mediastinal mass, decreased in size from recent PET/CT. Residual or recurrent tumor is not excluded.      08/12/2015 PET scan    Decreased metabolic activity of the anterior mediastinal lymph nodes. No new sites of hypermetabolic disease. (Deauville 3). Mild radiotracer uptake in the right upper anterior chest wall skin, which is likely related to focus of inflammation or sequelae of trauma.      09/29/2015 PET scan    Interval slight decrease in FDG activity in the dominant anterior mediastinal mass. A smaller adjacent mediastinal mass is unchanged from prior study. No evidence of new FDG avid disease (Deauville 3). Interval decrease in FDG activity in the superficial tissues of the right anterior chest wall, favored to represent inflammatory change.      11/08/2015 - 12/09/2015 Radiation Therapy    She received radiation treatment to anterior Mediastinum treated to 41.40 Gy in 23 fractions      02/21/2016 PET scan    Since the PET of 09/28/2015, decreased size of an anterior mediastinal mass with unchanged hypermetabolism, similar to the  mediastinal pool. (Deauville 2). No this new sites of disease identified      07/20/2016 Imaging    CT chest showed partially calcified anterior mediastinal mass is decreased in size when compared with study from 07/22/2015 and 02/21/2016. 2. 3 mm nodule in the right upper lobe is identified. Not definitely seen on previous exam which may reflect differences in technique. Attention on follow-up imaging advise. 3. New paramediastinal fibrotic changes within both upper lobes compatible with changes of external beam radiation.       INTERVAL HISTORY: Please see below for problem oriented charting. She returns for further follow-up She have intermittent chest discomfort that comes and goes No recent infection No new lymphadenopathy  REVIEW OF SYSTEMS:   Constitutional: Denies fevers, chills or abnormal weight loss Eyes: Denies blurriness of vision Ears, nose, mouth, throat, and face: Denies mucositis or sore throat Respiratory: Denies cough, dyspnea or wheezes Cardiovascular: Denies palpitation, chest discomfort or lower extremity swelling Gastrointestinal:  Denies nausea, heartburn or change in bowel habits Skin: Denies abnormal skin rashes Lymphatics: Denies new lymphadenopathy or easy bruising Neurological:Denies numbness, tingling or new weaknesses Behavioral/Psych: Mood is stable, no new changes  All other systems were reviewed with the patient and are negative.  I have reviewed the past medical history, past surgical history, social history and family history with the patient and they are unchanged from previous note.  ALLERGIES:  has No Known Allergies.  MEDICATIONS:  Current Outpatient Prescriptions  Medication Sig Dispense Refill  . Cholecalciferol (VITAMIN D) 2000 units CAPS Take 2,000 Units by mouth daily.     No current facility-administered medications for this visit.     PHYSICAL EXAMINATION: ECOG PERFORMANCE STATUS: 0 - Asymptomatic  Vitals:   04/08/17 0922   BP: (!) 134/101  Pulse: (!) 105  Resp: 17  Temp: 98.6 F (37 C)  SpO2: 100%   Filed Weights   04/08/17 0922  Weight: 118 lb 11.2 oz (53.8 kg)    GENERAL:alert, no distress and comfortable SKIN: skin color, texture, turgor are normal, no rashes or significant lesions EYES: normal, Conjunctiva are pink and non-injected, sclera clear OROPHARYNX:no exudate, no erythema and lips, buccal mucosa, and tongue normal  NECK: supple, thyroid normal size, non-tender, without nodularity LYMPH:  no palpable lymphadenopathy in the cervical, axillary or inguinal LUNGS: clear to auscultation and percussion with normal breathing effort HEART: regular rate & rhythm and no murmurs and no lower extremity edema ABDOMEN:abdomen soft, non-tender and normal bowel sounds Musculoskeletal:no cyanosis of digits and no clubbing  NEURO: alert & oriented x 3 with fluent speech, no focal motor/sensory deficits  LABORATORY DATA:  I have reviewed the data as listed    Component Value Date/Time   NA 139 04/08/2017 0912   K 3.1 (L) 04/08/2017 0912   CL 106 04/22/2015 1353   CO2 25 04/08/2017 0912   GLUCOSE 55 (L) 04/08/2017 0912   BUN 9.4 04/08/2017 0912   CREATININE 0.9 04/08/2017 0912   CALCIUM 9.8 04/08/2017 0912   PROT 7.8 04/08/2017 0912   ALBUMIN 4.4 04/08/2017 0912   AST 21 04/08/2017 0912   ALT 19 04/08/2017 0912   ALKPHOS 71 04/08/2017 0912   BILITOT 0.61 04/08/2017 0912   GFRNONAA >60 04/22/2015 1353   GFRAA >60 04/22/2015 1353    No results found for: SPEP, UPEP  Lab Results  Component Value Date   WBC 5.4 04/08/2017   NEUTROABS 4.3 04/08/2017   HGB 13.3 04/08/2017   HCT 38.8 04/08/2017   MCV 94.6 04/08/2017   PLT 220 04/08/2017      Chemistry      Component Value Date/Time   NA 139 04/08/2017 0912   K 3.1 (L) 04/08/2017 0912   CL 106 04/22/2015 1353   CO2 25 04/08/2017 0912   BUN 9.4 04/08/2017 0912   CREATININE 0.9 04/08/2017 0912      Component Value Date/Time   CALCIUM  9.8 04/08/2017 0912   ALKPHOS 71 04/08/2017 0912   AST 21 04/08/2017 0912   ALT 19 04/08/2017 0912   BILITOT 0.61 04/08/2017 0912     ASSESSMENT & PLAN:  Mediastinal (thymic) large B-cell lymphoma (HCC) The patient has completed 5 cycles of chemotherapy and radiation therapy. Clinically, she has no signs of disease recurrence. Plan to repeat imaging study when I see her back. I will order blood work and imaging study in February We discussed the importance of preventive care and reviewed the vaccination programs. She does not have any prior allergic reactions to influenza vaccination. She agrees to proceed with influenza vaccination today and we will administer it today at the clinic.   Hypokalemia due to inadequate potassium intake The cause of low potassium is unknown It could be related to poor oral intake I recommend potassium rich diet   Orders Placed This Encounter  Procedures  . CT CHEST W CONTRAST    Standing Status:   Future    Standing Expiration Date:  05/13/2018    Order Specific Question:   If indicated for the ordered procedure, I authorize the administration of contrast media per Radiology protocol    Answer:   Yes    Order Specific Question:   Preferred imaging location?    Answer:   [facility]    Order Specific Question:   Radiology Contrast Protocol - do NOT remove file path    Answer:   [email or web address]    Order Specific Question:   Is patient pregnant?    Answer:   No  . Comprehensive metabolic panel    Standing Status:   Future    Standing Expiration Date:   05/13/2018  . CBC with Differential/Platelet    Standing Status:   Future    Standing Expiration Date:   05/13/2018  . Lactate dehydrogenase    Standing Status:   Future    Standing Expiration Date:   05/13/2018  . Pregnancy, urine    Standing Status:   Future    Standing Expiration Date:   05/13/2018  . TSH    Standing Status:   Future    Standing  Expiration Date:   05/13/2018   All questions were answered. The patient knows to call the clinic with any problems, questions or concerns. No barriers to learning was detected. I spent 15 minutes counseling the patient face to face. The total time spent in the appointment was 20 minutes and more than 50% was on counseling and review of test results     Heath Lark, MD 04/08/2017 10:17 AM

## 2017-04-08 NOTE — Telephone Encounter (Signed)
Printed avs and calender for upcoming appointment. per 10/15 los 

## 2017-06-28 ENCOUNTER — Emergency Department (HOSPITAL_COMMUNITY)
Admission: EM | Admit: 2017-06-28 | Discharge: 2017-06-28 | Disposition: A | Discharge status: Home / Self Care | Payer: Self-pay | Attending: Emergency Medicine | Admitting: Emergency Medicine

## 2017-06-28 ENCOUNTER — Other Ambulatory Visit: Payer: Self-pay

## 2017-06-28 ENCOUNTER — Encounter (HOSPITAL_COMMUNITY): Payer: Self-pay | Admitting: Emergency Medicine

## 2017-06-28 DIAGNOSIS — M62830 Muscle spasm of back: Secondary | ICD-10-CM | POA: Insufficient documentation

## 2017-06-28 DIAGNOSIS — Z87891 Personal history of nicotine dependence: Secondary | ICD-10-CM | POA: Insufficient documentation

## 2017-06-28 DIAGNOSIS — Z79899 Other long term (current) drug therapy: Secondary | ICD-10-CM | POA: Insufficient documentation

## 2017-06-28 MED ORDER — HYDROCODONE-ACETAMINOPHEN 5-325 MG PO TABS
1.0000 | ORAL_TABLET | Freq: Once | ORAL | Status: AC
  Administered 2017-06-28: 1 via ORAL
  Filled 2017-06-28: qty 1

## 2017-06-28 MED ORDER — METHOCARBAMOL 500 MG PO TABS: 500.0000 mg | ORAL_TABLET | Freq: Two times a day (BID) | ORAL | 0 refills | Status: DC

## 2017-06-28 MED ORDER — NAPROXEN 500 MG PO TABS: 500.0000 mg | ORAL_TABLET | Freq: Two times a day (BID) | ORAL | 0 refills | Status: DC

## 2017-06-28 MED ORDER — HYDROCODONE-ACETAMINOPHEN 5-325 MG PO TABS: 1.0000 | ORAL_TABLET | Freq: Four times a day (QID) | ORAL | 0 refills | Status: DC | PRN

## 2017-06-28 MED ORDER — METHOCARBAMOL 500 MG PO TABS
750.0000 mg | ORAL_TABLET | Freq: Once | ORAL | Status: AC
  Administered 2017-06-28: 750 mg via ORAL
  Filled 2017-06-28: qty 2

## 2017-06-28 MED ORDER — KETOROLAC TROMETHAMINE 60 MG/2ML IM SOLN
30.0000 mg | Freq: Once | INTRAMUSCULAR | Status: AC
  Administered 2017-06-28: 30 mg via INTRAMUSCULAR
  Filled 2017-06-28: qty 2

## 2017-06-28 NOTE — ED Provider Notes (Signed)
[facility] DEPT Provider Note   CSN: [id number] Arrival date & time: 06/28/17  0909     History   Chief Complaint Chief Complaint  Patient presents with  . Back Pain    HPI Katrina Aguilar is a 32 y.o. female.  HPI Katrina Aguilar is a 32 y.o. female with history of non-Hodgkin's lymphoma, chronic back pain, chronic pain syndrome, presents to emergency department complaining of acute back pain.  Patient states that she developed pain around her thoracic spine radiating all over her back, approximately 3 days ago.  She states pain feels muscular, states feels like "spasms."  She denies any injuries.  Denies any heavy lifting.  She states pain began when she was reaching to get her toothbrush of the top shelf.  She reports similar pain in the past, had been evaluated at [facility] where she was receiving her treatment for lymphoma, and had an MRI which showed only minimal disc disease.  Patient states she was referred to pain management but did not go because she lost her insurance.  She has not had any major problems with her back since then.  He denies any abdominal pain.  She denies any chest pain.  No fever or chills.  No IV drug use.  No pain radiating down extremities.  No trouble controlling her bladder or bowels.  She has been taking ibuprofen and Tylenol which has not helped.  Pain is worsened with any movement.  States she has been also sleeping and using heating pad throughout the day.  Past Medical History:  Diagnosis Date  . 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 Chronic back pain   . Chronic nausea 02/14/2016  . Hx of radiation therapy 11/09/15- 12/09/15   Anterior Mediastinum   . Pain syndrome, chronic   . Spondylolysis of thoracic region   . Whiplash   . Wound dehiscence, surgical     Patient Active Problem List   Diagnosis Date Noted  . Other fatigue 04/08/2017  . Hypokalemia due to inadequate  potassium intake 04/08/2017  . Quality of life palliative care encounter 05/24/2016  . Breast lump on right side at 10 o'clock position 02/14/2016  . 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 Chronic pain associated with significant psychosocial dysfunction 10/10/2015  . 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 BRONCHOSCOPY  05/16/2015  . CENTRAL VENOUS CATHETER TUNNELED INSERTION SINGLE LUMEN     Port Placement, removed Dec 2016  . Thoracoscopy, mediastinal space with biopsy  05/16/15    OB History    No data available       Home Medications    Prior to Admission medications   Medication Sig Start Date End Date Taking? Authorizing Provider  Cholecalciferol (VITAMIN D) 2000 units CAPS Take 2,000 Units by mouth daily.    [provider]    Family History Family History  Problem Relation Age of Onset  . Breast cancer Paternal Aunt   . Emphysema Sister   . Fibromyalgia Mother   . Hypertension Mother   . Heart disease Paternal Aunt   . Hypertension Father   . Ovarian cancer Paternal Aunt   . Scleroderma Sister   . Stomach cancer Paternal Uncle   . Hodgkin's lymphoma Cousin        on father's side    Social History Social History   Tobacco Use  . Smoking status:  Former Smoker    Packs/day: 0.50    Types: Cigarettes    Last attempt to quit: 06/26/2011    Years since quitting: 6.0  . Smokeless tobacco: Never Used  Substance Use Topics  . Alcohol use: Yes    Alcohol/week: 0.0 oz  . Drug use: No     Allergies   Patient has no known allergies.   Review of Systems Review of Systems  Constitutional: Negative for chills and fever.  Respiratory: Negative for cough, chest tightness and shortness of breath.   Cardiovascular: Negative for chest pain, palpitations and leg swelling.  Gastrointestinal: Negative for abdominal pain, diarrhea, nausea and vomiting.  Genitourinary: Negative for difficulty  urinating, dysuria, flank pain, frequency, pelvic pain, vaginal bleeding, vaginal discharge and vaginal pain.  Musculoskeletal: Positive for arthralgias and back pain. Negative for myalgias, neck pain and neck stiffness.  Skin: Negative for rash.  Neurological: Negative for dizziness, weakness and headaches.  All other systems reviewed and are negative.    Physical Exam Updated Vital Signs BP 112/80 (BP Location: Right Arm)   Pulse 77   Temp 98 F (36.7 C) (Oral)   Resp 16   LMP 02/24/2016 (Approximate) Comment: had hx of depo / injection but pt states she hasn't had injection for 9 months and has not had mp since sept 2017.  SpO2 100%   Physical Exam  Constitutional: She appears well-developed and well-nourished. No distress.  HENT:  Head: Normocephalic.  Eyes: Conjunctivae are normal.  Neck: Neck supple.  Cardiovascular: Normal rate, regular rhythm and normal heart sounds.  Pulmonary/Chest: Effort normal and breath sounds normal. No respiratory distress. She has no wheezes. She has no rales.  Abdominal: Soft. Bowel sounds are normal. She exhibits no distension. There is no tenderness. There is no rebound.  Musculoskeletal: She exhibits no edema.  Tenderness to palpation over right paraspinal muscles around thoracic and upper lumbar spine.  Full range of motion of bilateral lower upper extremities.  Neurological: She is alert.  5/5 and equal lower extremity strength. 2+ and equal patellar reflexes bilaterally. Pt able to dorsiflex bilateral toes and feet with good strength against resistance. Equal sensation bilaterally over thighs and lower legs.   Skin: Skin is warm and dry.  Psychiatric: She has a normal mood and affect. Her behavior is normal.  Nursing note and vitals reviewed.    ED Treatments / Results  Labs (all labs ordered are listed, but only abnormal results are displayed) Labs Reviewed - No data to display  EKG  EKG Interpretation None        Radiology No results found.  Procedures Procedures (including critical care time)  Medications Ordered in ED Medications  ketorolac (TORADOL) injection 30 mg (30 mg Intramuscular Given 06/28/17 1252)  methocarbamol (ROBAXIN) tablet 750 mg (750 mg Oral Given 06/28/17 1251)  HYDROcodone-acetaminophen (NORCO/VICODIN) 5-325 MG per tablet 1 tablet (1 tablet Oral Given 06/28/17 1251)     Initial Impression / Assessment and Plan / ED Course  I have reviewed the triage vital signs and the nursing notes.  Pertinent labs & imaging results that were available during my care of the patient were reviewed by me and considered in my medical decision making (see chart for details).     Patient with history of non Hodgkin's lymphoma, finished radiation and chemotherapy 1 year ago, here with acute onset of back pain.  She is neurovascularly intact, no red flags to suggest cauda equina or spinal cord compression.  I offered her imaging  to evaluate for any possible bony lesions, however patient refused.  She stated that it feels like spasms that she has had in the past.  She is requesting pain medications and muscle relaxants.  We will give her Toradol, Vicodin, Robaxin the emergency department.  Home with muscle relaxants and pain meds and follow-up with primary care doctor.  Return precautions discussed.  Patient was reviewed in [place] prescription database, she has not had any prescriptions for any controlled substances since 2017 when she had her cancer treatment.   Vitals:   06/28/17 1020 06/28/17 1306  BP: 112/80 122/78  Pulse: 77 78  Resp: 16 18  Temp: 98 F (36.7 C) 98.5 F (36.9 C)  SpO2: 100% 98%    Final Clinical Impressions(s) / ED Diagnoses   Final diagnoses:  Muscle spasm of back    ED Discharge Orders        Ordered    HYDROcodone-acetaminophen (NORCO) 5-325 MG tablet  Every 6 hours PRN     06/28/17 1302    methocarbamol (ROBAXIN) 500 MG tablet  2 times daily      06/28/17 1302    naproxen (NAPROSYN) 500 MG tablet  2 times daily     06/28/17 1302       Jeannett Senior, PA-C 06/28/17 1602    Blanchie Dessert, MD 06/29/17 2118

## 2017-06-28 NOTE — ED Triage Notes (Signed)
Pt c/o mid and lower back spasms. Pt denies n/v/d/, denies urine sx, pt denies injury. Pt c/o pain x 2 days, pt has taken Tylenol and Ibuprofen otc and no relief for pain.

## 2017-06-28 NOTE — Discharge Instructions (Signed)
Continue heating pads and stretches.  Take medications as prescribed.  Please follow-up with family doctor especially if not improving for further imaging.  Return if worsening symptoms.

## 2017-06-28 NOTE — ED Notes (Signed)
Bed: WA29 Expected date:  Expected time:  Means of arrival:  Comments: 

## 2017-08-22 ENCOUNTER — Inpatient Hospital Stay: Payer: Medicare Other | Attending: Hematology and Oncology

## 2017-08-22 ENCOUNTER — Ambulatory Visit (HOSPITAL_COMMUNITY)
Admission: RE | Admit: 2017-08-22 | Discharge: 2017-08-22 | Disposition: A | Discharge status: Home / Self Care | Payer: Medicare Other | Source: Ambulatory Visit | Attending: Hematology and Oncology | Admitting: Hematology and Oncology

## 2017-08-22 ENCOUNTER — Encounter (HOSPITAL_COMMUNITY): Payer: Self-pay

## 2017-08-22 DIAGNOSIS — R911 Solitary pulmonary nodule: Secondary | ICD-10-CM | POA: Insufficient documentation

## 2017-08-22 DIAGNOSIS — C8522 Mediastinal (thymic) large B-cell lymphoma, intrathoracic lymph nodes: Secondary | ICD-10-CM | POA: Insufficient documentation

## 2017-08-22 DIAGNOSIS — C8510 Unspecified B-cell lymphoma, unspecified site: Secondary | ICD-10-CM | POA: Diagnosis not present

## 2017-08-22 DIAGNOSIS — R5383 Other fatigue: Secondary | ICD-10-CM

## 2017-08-22 LAB — CBC WITH DIFFERENTIAL/PLATELET
Basophils Absolute: 0 10*3/uL (ref 0.0–0.1)
Basophils Relative: 0 %
Eosinophils Absolute: 0.1 10*3/uL (ref 0.0–0.5)
Eosinophils Relative: 3 %
HEMATOCRIT: 36.2 % (ref 34.8–46.6)
HEMOGLOBIN: 12.2 g/dL (ref 11.6–15.9)
LYMPHS ABS: 0.8 10*3/uL — AB (ref 0.9–3.3)
Lymphocytes Relative: 28 %
MCH: 31.3 pg (ref 25.1–34.0)
MCHC: 33.7 g/dL (ref 31.5–36.0)
MCV: 92.8 fL (ref 79.5–101.0)
MONOS PCT: 11 %
Monocytes Absolute: 0.3 10*3/uL (ref 0.1–0.9)
NEUTROS PCT: 58 %
Neutro Abs: 1.8 10*3/uL (ref 1.5–6.5)
Platelets: 272 10*3/uL (ref 145–400)
RBC: 3.9 MIL/uL (ref 3.70–5.45)
RDW: 13.4 % (ref 11.2–14.5)
WBC: 3 10*3/uL — ABNORMAL LOW (ref 3.9–10.3)

## 2017-08-22 LAB — COMPREHENSIVE METABOLIC PANEL
ALK PHOS: 64 U/L (ref 40–150)
ALT: 13 U/L (ref 0–55)
ANION GAP: 11 (ref 3–11)
AST: 15 U/L (ref 5–34)
Albumin: 4.1 g/dL (ref 3.5–5.0)
BILIRUBIN TOTAL: 0.6 mg/dL (ref 0.2–1.2)
BUN: 8 mg/dL (ref 7–26)
CALCIUM: 9.6 mg/dL (ref 8.4–10.4)
CO2: 24 mmol/L (ref 22–29)
Chloride: 105 mmol/L (ref 98–109)
Creatinine, Ser: 0.82 mg/dL (ref 0.60–1.10)
Glucose, Bld: 90 mg/dL (ref 70–140)
Potassium: 3.7 mmol/L (ref 3.5–5.1)
Sodium: 140 mmol/L (ref 136–145)
TOTAL PROTEIN: 7.4 g/dL (ref 6.4–8.3)

## 2017-08-22 LAB — TSH: TSH: 2.187 u[IU]/mL (ref 0.308–3.960)

## 2017-08-22 LAB — LACTATE DEHYDROGENASE: LDH: 158 U/L (ref 125–245)

## 2017-08-22 MED ORDER — IOPAMIDOL (ISOVUE-300) INJECTION 61%
INTRAVENOUS | Status: AC
  Administered 2017-08-22: 75 mL via INTRAVENOUS
  Filled 2017-08-22: qty 75

## 2017-08-22 MED ORDER — IOPAMIDOL (ISOVUE-300) INJECTION 61%
75.0000 mL | Freq: Once | INTRAVENOUS | Status: AC | PRN
  Administered 2017-08-22: 75 mL via INTRAVENOUS

## 2017-08-23 ENCOUNTER — Ambulatory Visit: Payer: Self-pay | Admitting: Hematology and Oncology

## 2017-08-26 ENCOUNTER — Ambulatory Visit: Payer: Self-pay | Admitting: Hematology and Oncology

## 2017-08-27 ENCOUNTER — Telehealth: Payer: Self-pay | Admitting: Hematology and Oncology

## 2017-08-27 NOTE — Telephone Encounter (Signed)
Spoke to patient regarding upcoming march appointments per 3/4 sch message.

## 2017-08-30 ENCOUNTER — Encounter: Payer: Self-pay | Admitting: Hematology and Oncology

## 2017-08-30 ENCOUNTER — Inpatient Hospital Stay: Payer: Medicare Other | Attending: Hematology and Oncology | Admitting: Hematology and Oncology

## 2017-08-30 ENCOUNTER — Telehealth: Payer: Self-pay | Admitting: Hematology and Oncology

## 2017-08-30 DIAGNOSIS — D72819 Decreased white blood cell count, unspecified: Secondary | ICD-10-CM | POA: Diagnosis not present

## 2017-08-30 DIAGNOSIS — Z9221 Personal history of antineoplastic chemotherapy: Secondary | ICD-10-CM | POA: Diagnosis not present

## 2017-08-30 DIAGNOSIS — R918 Other nonspecific abnormal finding of lung field: Secondary | ICD-10-CM | POA: Insufficient documentation

## 2017-08-30 DIAGNOSIS — Z79899 Other long term (current) drug therapy: Secondary | ICD-10-CM | POA: Diagnosis not present

## 2017-08-30 DIAGNOSIS — Z8572 Personal history of non-Hodgkin lymphomas: Secondary | ICD-10-CM | POA: Insufficient documentation

## 2017-08-30 DIAGNOSIS — Z1239 Encounter for other screening for malignant neoplasm of breast: Secondary | ICD-10-CM | POA: Insufficient documentation

## 2017-08-30 DIAGNOSIS — C8522 Mediastinal (thymic) large B-cell lymphoma, intrathoracic lymph nodes: Secondary | ICD-10-CM

## 2017-08-30 DIAGNOSIS — Z923 Personal history of irradiation: Secondary | ICD-10-CM | POA: Diagnosis not present

## 2017-08-30 NOTE — Telephone Encounter (Signed)
Gave avs and calendar for September  °

## 2017-08-30 NOTE — Assessment & Plan Note (Signed)
I will order screening mammogram

## 2017-08-30 NOTE — Assessment & Plan Note (Signed)
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

## 2017-08-30 NOTE — Progress Notes (Signed)
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 Clinically, she has no signs of disease recurrence. The patient is at risk of rest cancer due to exposure to radiation. I recommend baseline screening mammogram We discussed the importance of preventive care and reviewed the vaccination programs.  She is also at risk of acquired hypothyroidism due to radiation exposure.  TSH is normal I have reviewed the current guidelines I plan to see her back in 6 months with repeat history, physical examination and blood work only.  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   Orders Placed This Encounter  Procedures  . MM DIGITAL SCREENING BILATERAL    Standing Status:   Future    Standing Expiration Date:   10/30/2018    Order Specific Question:   Reason for Exam (SYMPTOM  OR DIAGNOSIS REQUIRED)    Answer:   at high risk for breast ca due to prior radiation    Order Specific Question:   Is the patient pregnant?    Answer:   No    Order Specific Question:   Preferred imaging location?    Answer:   [facility]    INTERVAL HISTORY: Please see below for problem oriented charting. 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 Given the appearance and patient's age, this may represent an unusually prominent hyperplastic thymus, versus thymoma, lymphoma, etc. Assuming prior exams are not available to confirm stability, recommend chest MR with and without contrast including in and out of phase imaging for further evaluation      09/14/2014 Imaging    MR chest at [facility] showed partially solid, partially cystic anterior mediastinal mass. The cystic component is new compared to the prior study 07/07/2014 and has mural nodularity. This may represent internal hemorrhage. The cystic component mural nodularity. The chemical shift ratio does not favor benign thymic hyperplasia. The differential is led by cystic thymoma. Lymphoma and germ cell tumor are also considerations, but felt less likely. If tissue diagnosis is not obtained, recommend 3 month follow-up thoracic MRI with contrast material.      12/21/2014 Imaging    MR chest at [facility] showed a soft tissue mass within the prevascular space of the anterior mediastinum. Note that this examination is limited due to suboptimal technique and the lack of intravenous contrast. Within this context, this lesion is stable to perhaps slightly decreased in size from the comparison study.The previously seen cystic component of this lesion is no longer as well identified, however this is most likely due to suboptimal technique.      04/10/2015 Imaging    CT chest no evidence of pulmonary embolus or right heart strain. Prominent central pulmonary artery trunk with no evidence of central pulmonary artery enlargement. Large anterior and right-sided mediastinal mass.  Differential diagnosis includes thymoma, teratoma, lymphoma, and less likely extra medullary hematopoiesis Small right greater than left pleural effusions and peribronchial streaky opacities bilateral lower lobes subsegmental atelectasis or infiltrate      04/27/2015  Imaging    MR chest showed enlarging bilobed enhancing anterior mediastinal mass with central necrosis within the right-sided component. Broad abutment of the pericardium is concerning for possible invasion. There is possible encasement of the right mammary vessels. No definite evidence of vascular invasion of the SVC, left brachiocephalic vein, or mammary vessels despite the close proximity of the mass. Based on the patient's age, this remains concerning for germ cell tumor, thymoma, or lymphoma. Consider further evaluation with direct tissue sampling and germ cell tumor markers.      05/04/2015 PET scan    PET scan at [facility] showed hypermetabolic bilobed anterior mediastinal mass with central necrosis in the right sided component is concerning for malignancy with differential including germ cell tumor, thymoma/thymic carcinoma, or lymphoma. There is no evidence of FDG avid disease elsewhere in the chest, abdomen, or pelvis.      05/16/2015 Pathology Results    Mediastinal LN biopsy: The sections show a diffuse proliferation of medium to large-sized lymphoid cells with round and irregular nuclear contours and abundant eosinophilic to clear cytoplasm. Occasional multi-lobated cells are seen. Scattered mitotic figures and apoptotic bodies are present. There are thin bands of compartmentalizing fibrosis within the infiltrate in addition to thick bands of collagen fibrosis. After the initial morphologic evaluation, immunohistochemical stains are performed on block C1 (control tissue reacts properly). The abnormal lymphoid cells are B-cells, which stain with CD20, PAX-5 and CD79a. The abnormal cells are positive for CD23 and MUM-1 and are negative for CD10. CD3 and CD5 highlight many intermixed small T-cells. Approximately 10-20% of the cells express BCL-6, while the majority appear negative for BCL-2. The Ki-67 proliferation index is approximately 30-40%. The cytokeratin AE1/AE3 stain highlights background  mesothelial cells. CD30 shows patchy, weak staining of the abnormal lymphoid cells, while CD15 appears to highlight granulocytic cells.           05/25/2015 Initial Diagnosis    She was seen at [facility]      05/30/2015 Imaging    Outside [facility] showed normal left ventricular wall motion. The left ventricular ejection fraction is 66.2%.      06/01/2015 Bone Marrow Biopsy    Outside bone marrow biopsy was negative      06/13/2015 - 09/14/2015 Chemotherapy    The patient had 5 cycles of RCHOP chemotherapy treatment at [facility]        07/22/2015 Imaging    CT chest showed residual solid anterior mediastinal mass, decreased in size from recent PET/CT. Residual or recurrent tumor is not excluded.      08/12/2015 PET scan    Decreased metabolic activity of the anterior mediastinal lymph nodes. No new sites of hypermetabolic disease. ([place] 3). Mild radiotracer uptake in the right upper anterior chest wall skin, which is likely related to focus of inflammation or sequelae of trauma.      09/29/2015 PET scan    Interval slight decrease in FDG activity in the dominant anterior mediastinal mass. A smaller adjacent mediastinal mass is unchanged from prior study. No evidence of new FDG avid disease ([place] 3). Interval decrease in FDG activity in the superficial tissues of the right anterior chest wall, favored to represent inflammatory change.      11/08/2015 - 12/09/2015 Radiation Therapy    She received radiation treatment  to anterior Mediastinum treated to 41.40 Gy in 23 fractions      02/21/2016 PET scan    Since the PET of 09/28/2015, decreased size of an anterior mediastinal mass with unchanged hypermetabolism, similar to the mediastinal pool. ([place] 2). No this new sites of disease identified      07/20/2016 Imaging    CT chest showed partially calcified anterior mediastinal mass is decreased in size when compared with study from 07/22/2015 and 02/21/2016. 2. 3 mm nodule in the right  upper lobe is identified. Not definitely seen on previous exam which may reflect differences in technique. Attention on follow-up imaging advise. 3. New paramediastinal fibrotic changes within both upper lobes compatible with changes of external beam radiation.      08/22/2017 Imaging    1. Slight decrease in volume of previously partially calcified anterior mediastinal mass compatible with treated tumor. 2. No new sites of disease. 3. 4 mm right upper lobe lung nodule, unchanged.       REVIEW OF SYSTEMS:   Constitutional: Denies fevers, chills or abnormal weight loss Eyes: Denies blurriness of vision Ears, nose, mouth, throat, and face: Denies mucositis or sore throat Respiratory: Denies cough, dyspnea or wheezes Cardiovascular: Denies palpitation, chest discomfort or lower extremity swelling Gastrointestinal:  Denies nausea, heartburn or change in bowel habits Skin: Denies abnormal skin rashes Lymphatics: Denies new lymphadenopathy or easy bruising Neurological:Denies numbness, tingling or new weaknesses Behavioral/Psych: Mood is stable, no new changes  All other systems were reviewed with the patient and are negative.  I have reviewed the past medical history, past surgical history, social history and family history with the patient and they are unchanged from previous note.  ALLERGIES:  has No Known Allergies.  MEDICATIONS:  Current Outpatient Medications  Medication Sig Dispense Refill  . acetaminophen (TYLENOL) 500 MG tablet Take 1,000 mg by mouth daily as needed (PAIN).    Marland Kitchen HYDROcodone-acetaminophen (NORCO) 5-325 MG tablet Take 1 tablet by mouth every 6 (six) hours as needed for moderate pain. 10 tablet 0  . ibuprofen (ADVIL,MOTRIN) 200 MG tablet Take 600 mg by mouth daily as needed (PAIN).    Marland Kitchen Menthol, Topical Analgesic, (BIOFREEZE EX) Apply 1 application topically daily as needed (PAIN).    Marland Kitchen methocarbamol (ROBAXIN) 500 MG tablet Take 1 tablet (500 mg total) by mouth 2  (two) times daily. 20 tablet 0  . naproxen (NAPROSYN) 500 MG tablet Take 1 tablet (500 mg total) by mouth 2 (two) times daily. 30 tablet 0   No current facility-administered medications for this visit.     PHYSICAL EXAMINATION: ECOG PERFORMANCE STATUS: 0 - Asymptomatic  Vitals:   08/30/17 1051  BP: 125/86  Pulse: 87  Resp: 18  Temp: 98.3 F (36.8 C)  SpO2: 100%   Filed Weights   08/30/17 1051  Weight: 127 lb 3.2 oz (57.7 kg)    GENERAL:alert, no distress and comfortable SKIN: skin color, texture, turgor are normal, no rashes or significant lesions EYES: normal, Conjunctiva are pink and non-injected, sclera clear OROPHARYNX:no exudate, no erythema and lips, buccal mucosa, and tongue normal  NECK: supple, thyroid normal size, non-tender, without nodularity LYMPH:  no palpable lymphadenopathy in the cervical, axillary or inguinal LUNGS: clear to auscultation and percussion with normal breathing effort HEART: regular rate & rhythm and no murmurs and no lower extremity edema ABDOMEN:abdomen soft, non-tender and normal bowel sounds Musculoskeletal:no cyanosis of digits and no clubbing  NEURO: alert & oriented x 3 with fluent speech, no focal motor/sensory  deficits  LABORATORY DATA:  I have reviewed the data as listed    Component Value Date/Time   NA 140 08/22/2017 1125   NA 139 04/08/2017 0912   K 3.7 08/22/2017 1125   K 3.1 (L) 04/08/2017 0912   CL 105 08/22/2017 1125   CO2 24 08/22/2017 1125   CO2 25 04/08/2017 0912   GLUCOSE 90 08/22/2017 1125   GLUCOSE 55 (L) 04/08/2017 0912   BUN 8 08/22/2017 1125   BUN 9.4 04/08/2017 0912   CREATININE 0.82 08/22/2017 1125   CREATININE 0.9 04/08/2017 0912   CALCIUM 9.6 08/22/2017 1125   CALCIUM 9.8 04/08/2017 0912   PROT 7.4 08/22/2017 1125   PROT 7.8 04/08/2017 0912   ALBUMIN 4.1 08/22/2017 1125   ALBUMIN 4.4 04/08/2017 0912   AST 15 08/22/2017 1125   AST 21 04/08/2017 0912   ALT 13 08/22/2017 1125   ALT 19 04/08/2017  0912   ALKPHOS 64 08/22/2017 1125   ALKPHOS 71 04/08/2017 0912   BILITOT 0.6 08/22/2017 1125   BILITOT 0.61 04/08/2017 0912   GFRNONAA >60 08/22/2017 1125   GFRAA >60 08/22/2017 1125    No results found for: SPEP, UPEP  Lab Results  Component Value Date   WBC 3.0 (L) 08/22/2017   NEUTROABS 1.8 08/22/2017   HGB 12.2 08/22/2017   HCT 36.2 08/22/2017   MCV 92.8 08/22/2017   PLT 272 08/22/2017      Chemistry      Component Value Date/Time   NA 140 08/22/2017 1125   NA 139 04/08/2017 0912   K 3.7 08/22/2017 1125   K 3.1 (L) 04/08/2017 0912   CL 105 08/22/2017 1125   CO2 24 08/22/2017 1125   CO2 25 04/08/2017 0912   BUN 8 08/22/2017 1125   BUN 9.4 04/08/2017 0912   CREATININE 0.82 08/22/2017 1125   CREATININE 0.9 04/08/2017 0912      Component Value Date/Time   CALCIUM 9.6 08/22/2017 1125   CALCIUM 9.8 04/08/2017 0912   ALKPHOS 64 08/22/2017 1125   ALKPHOS 71 04/08/2017 0912   AST 15 08/22/2017 1125   AST 21 04/08/2017 0912   ALT 13 08/22/2017 1125   ALT 19 04/08/2017 0912   BILITOT 0.6 08/22/2017 1125   BILITOT 0.61 04/08/2017 0912       RADIOGRAPHIC STUDIES: I have reviewed recent and multiple imaging studies with the patient and husband I have personally reviewed the radiological images as listed and agreed with the findings in the report. Ct Chest W Contrast  Result Date: 08/22/2017 CLINICAL DATA:  Followup B-cell lymphoma. EXAM: CT CHEST WITH CONTRAST TECHNIQUE: Multidetector CT imaging of the chest was performed during intravenous contrast administration. CONTRAST:  75 cc Isovue-300 COMPARISON:  02/19/2017 FINDINGS: Cardiovascular: Normal heart size. No pericardial effusion identified. Mediastinum/Nodes: Normal appearance of the thyroid gland. The trachea appears patent and is midline. Normal appearance of the esophagus. Partially calcified soft tissue attenuating nodule within the anterior mediastinum measures 1.8 by 1.3 x 1.9 cm (volume = 2.3 cm^3). 1.5 x 1.92  x 2.18 cm (volume = 3.3 cm^3). Lungs/Pleura: No pleural effusion identified. Paramediastinal fibrosis is identified within both lungs compatible with changes due to external beam radiation. There is a small nodule within the right upper lobe which measures 4 mm, image 22/7. Unchanged from previous exam. Upper Abdomen: No acute abnormality. Musculoskeletal: No aggressive lytic or sclerotic bone lesions. IMPRESSION: 1. Slight decrease in volume of previously partially calcified anterior mediastinal mass compatible with treated tumor. 2.  No new sites of disease. 3. 4 mm right upper lobe lung nodule, unchanged. Electronically Signed   By: Kerby Moors M.D.   On: 08/22/2017 14:19    All questions were answered. The patient knows to call the clinic with any problems, questions or concerns. No barriers to learning was detected.  I spent 15 minutes counseling the patient face to face. The total time spent in the appointment was 20 minutes and more than 50% was on counseling and review of test results  Heath Lark, MD 08/30/2017 11:40 AM

## 2017-08-30 NOTE — Assessment & Plan Note (Signed)
The patient has completed chemotherapy and radiation therapy. Clinically, she has no signs of disease recurrence. The patient is at risk of rest cancer due to exposure to radiation. I recommend baseline screening mammogram We discussed the importance of preventive care and reviewed the vaccination programs.  She is also at risk of acquired hypothyroidism due to radiation exposure.  TSH is normal I have reviewed the current guidelines I plan to see her back in 6 months with repeat history, physical examination and blood work only.

## 2017-10-03 ENCOUNTER — Other Ambulatory Visit: Payer: Self-pay | Admitting: Hematology and Oncology

## 2017-10-03 DIAGNOSIS — Z1231 Encounter for screening mammogram for malignant neoplasm of breast: Secondary | ICD-10-CM

## 2017-10-30 ENCOUNTER — Ambulatory Visit
Admission: RE | Admit: 2017-10-30 | Discharge: 2017-10-30 | Disposition: A | Discharge status: Home / Self Care | Payer: Medicare Other | Source: Ambulatory Visit | Attending: Hematology and Oncology | Admitting: Hematology and Oncology

## 2017-10-30 DIAGNOSIS — Z1231 Encounter for screening mammogram for malignant neoplasm of breast: Secondary | ICD-10-CM

## 2017-11-16 DIAGNOSIS — K122 Cellulitis and abscess of mouth: Secondary | ICD-10-CM | POA: Diagnosis not present

## 2017-11-16 DIAGNOSIS — K029 Dental caries, unspecified: Secondary | ICD-10-CM | POA: Diagnosis not present

## 2017-11-16 DIAGNOSIS — L03211 Cellulitis of face: Secondary | ICD-10-CM | POA: Diagnosis not present

## 2017-11-22 DIAGNOSIS — R6884 Jaw pain: Secondary | ICD-10-CM | POA: Diagnosis not present

## 2018-01-21 IMAGING — CT CT OUTSIDE FILMS CHEST
2 of 5 series · 16 of 36 positions shown, 20 images · non-contrast
Comparison: none

[Series 3: bodyax · axial · 0.62mm/px · z∈[-283,-4]mm · 13 of 496 slices shown, 17 images]
[im 25/496  mediastinal]
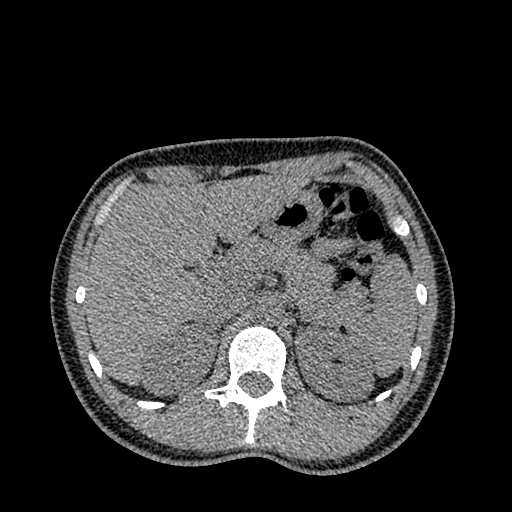
[im 25/496  lung]
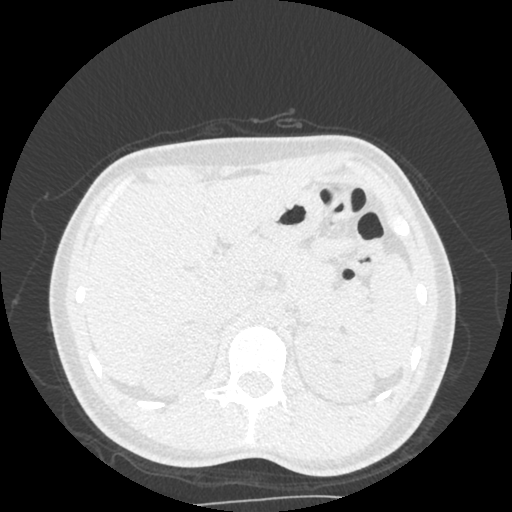
[im 75/496  lung]
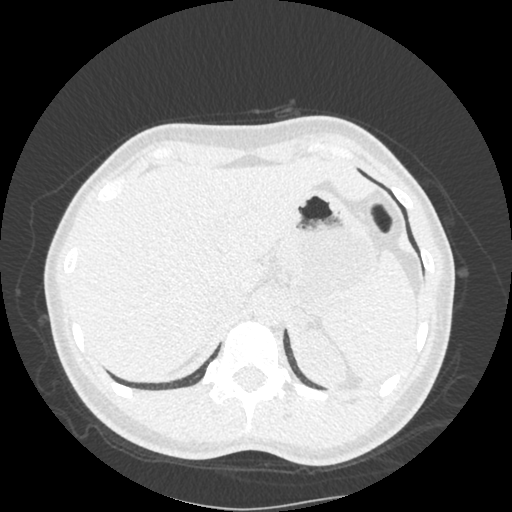
[im 100/496  lung]
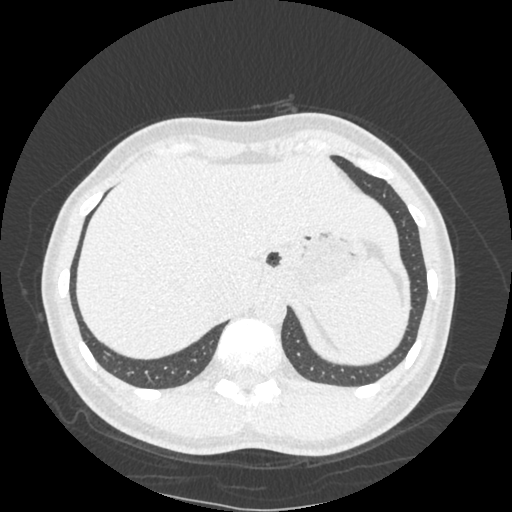
[im 149/496  lung]
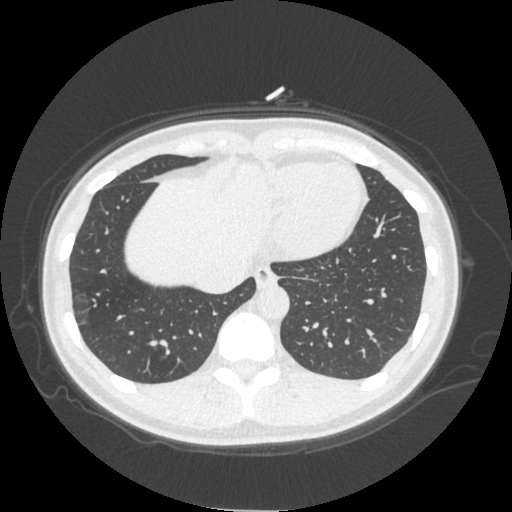
[im 174/496  mediastinal]
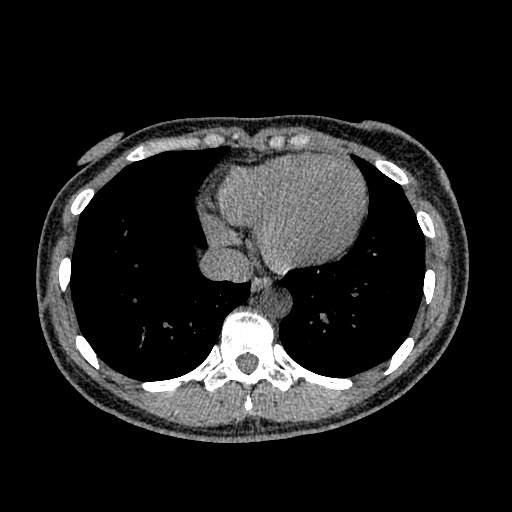
[im 174/496  lung]
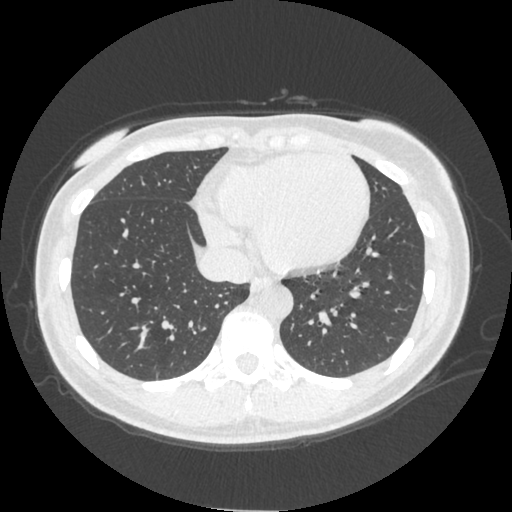
[im 223/496  lung]
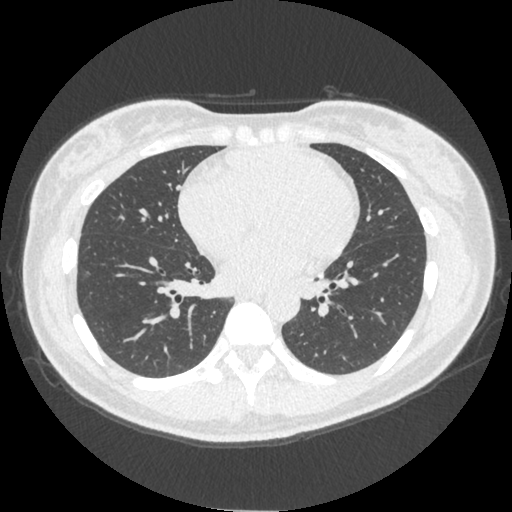
[im 248/496  lung]
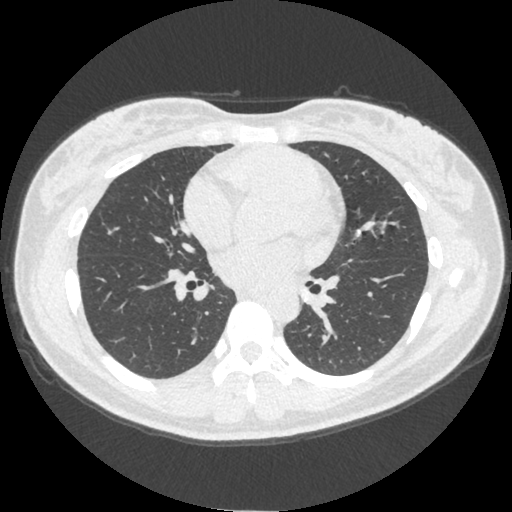
[im 273/496  lung]
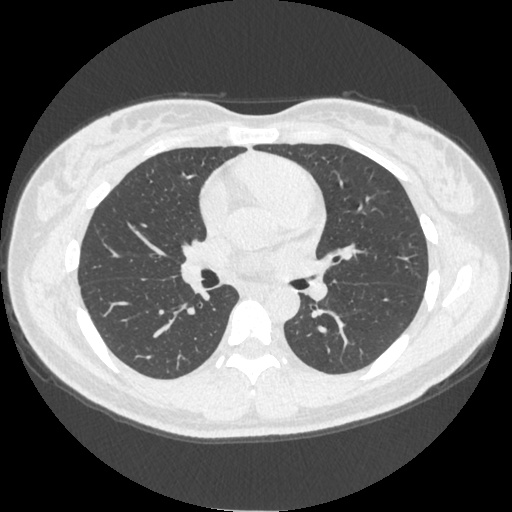
[im 322/496  mediastinal]
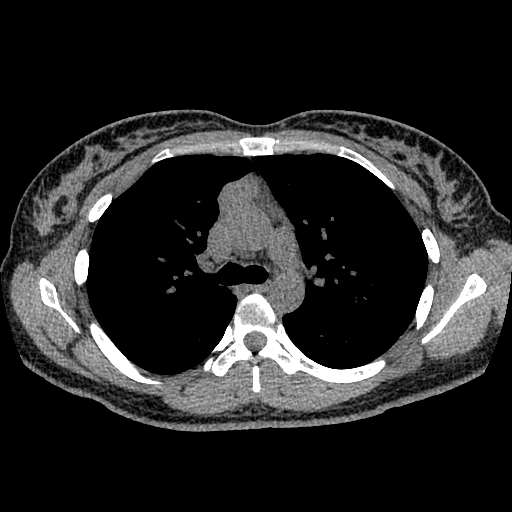
[im 322/496  lung]
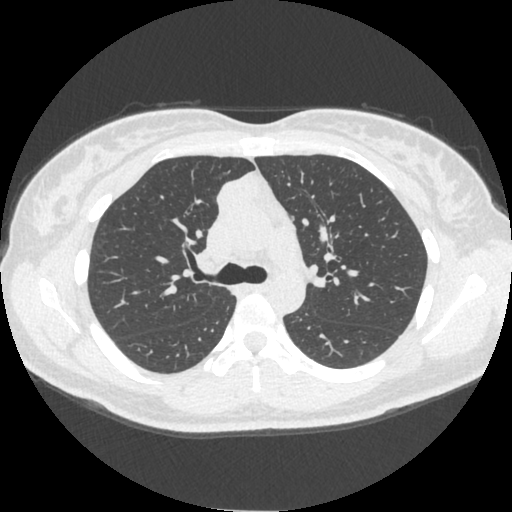
[im 347/496  lung]
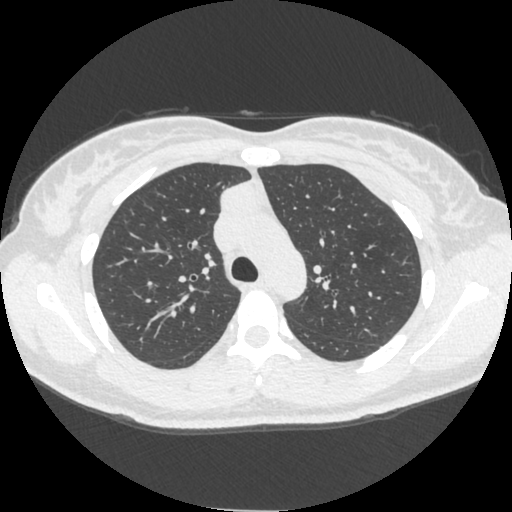
[im 397/496  lung]
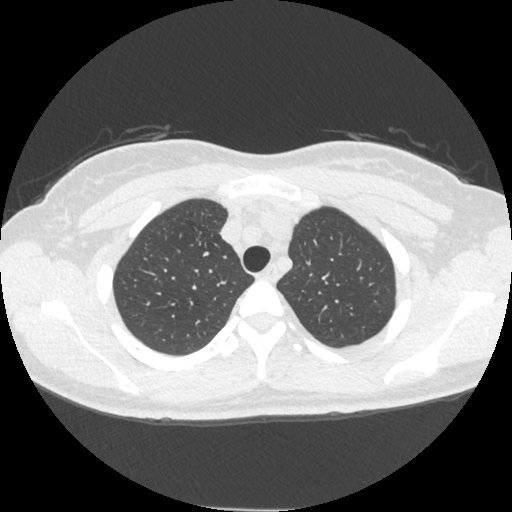
[im 421/496  lung]
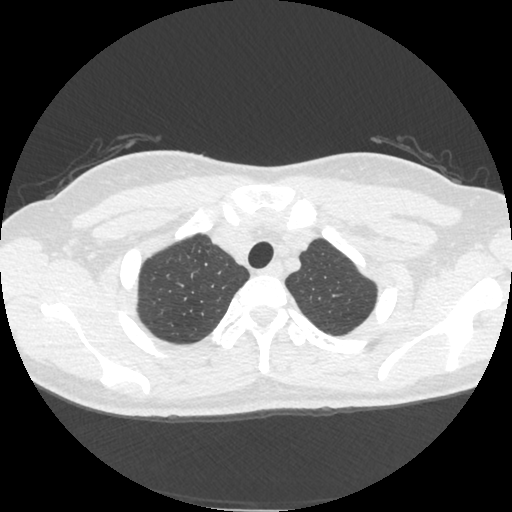
[im 471/496  mediastinal]
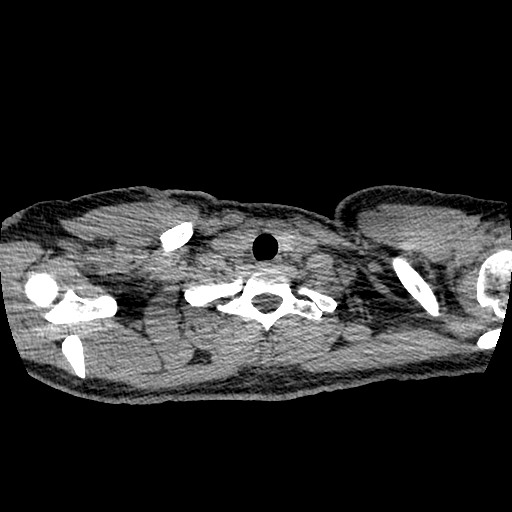
[im 471/496  lung]
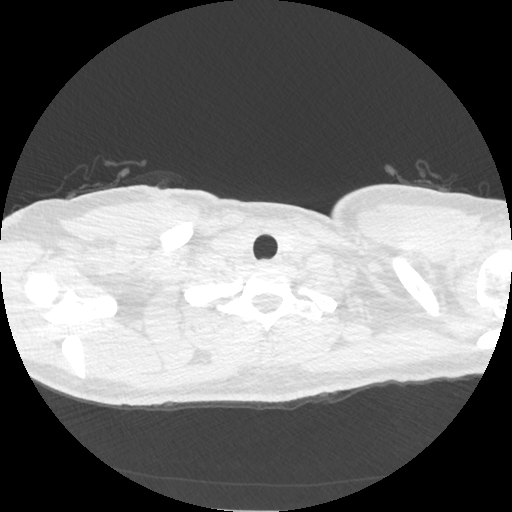

[Series 602: sag body · sagittal · 0.62mm/px · 3 of 108 slices shown]
[im 22/108  lung]
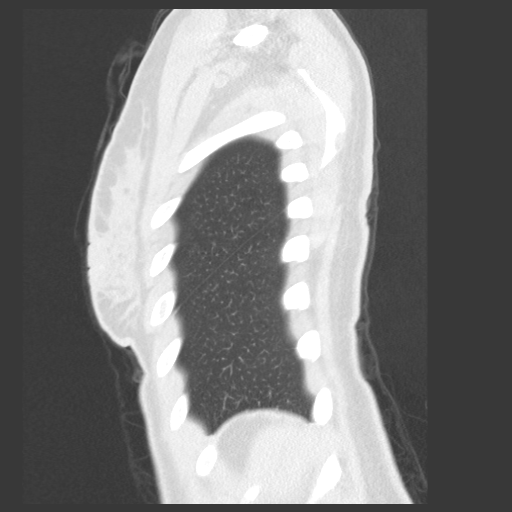
[im 43/108  lung]
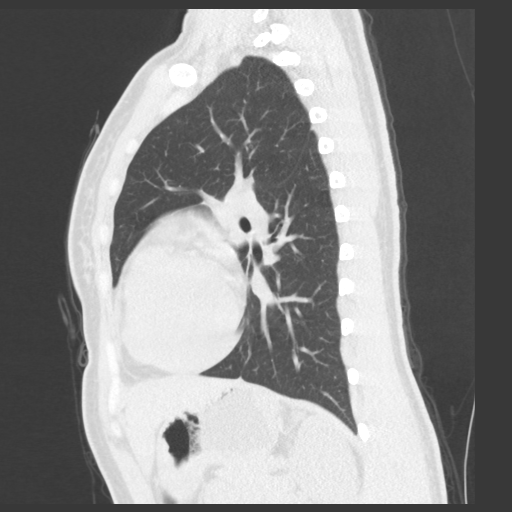
[im 65/108  lung]
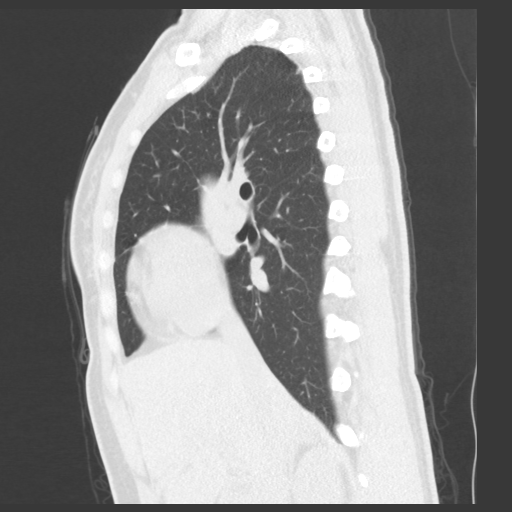

[16 of 36 positions shown; findings below may reference images not displayed]

Canned report from images found in remote index.

Refer to host system for actual result text.

## 2018-02-28 ENCOUNTER — Other Ambulatory Visit: Payer: Self-pay | Admitting: Hematology and Oncology

## 2018-02-28 DIAGNOSIS — C8522 Mediastinal (thymic) large B-cell lymphoma, intrathoracic lymph nodes: Secondary | ICD-10-CM

## 2018-03-03 ENCOUNTER — Inpatient Hospital Stay: Payer: Medicare Other

## 2018-03-03 ENCOUNTER — Telehealth: Payer: Self-pay

## 2018-03-03 ENCOUNTER — Telehealth: Payer: Self-pay | Admitting: Hematology and Oncology

## 2018-03-03 ENCOUNTER — Encounter: Payer: Self-pay | Admitting: Hematology and Oncology

## 2018-03-03 ENCOUNTER — Inpatient Hospital Stay: Payer: Medicare Other | Attending: Hematology and Oncology | Admitting: Hematology and Oncology

## 2018-03-03 VITALS — BP 138/90 | HR 92 | Temp 97.7°F | Resp 18 | Ht 62.0 in | Wt 115.6 lb

## 2018-03-03 DIAGNOSIS — M542 Cervicalgia: Secondary | ICD-10-CM | POA: Diagnosis not present

## 2018-03-03 DIAGNOSIS — Z923 Personal history of irradiation: Secondary | ICD-10-CM | POA: Diagnosis not present

## 2018-03-03 DIAGNOSIS — Z9221 Personal history of antineoplastic chemotherapy: Secondary | ICD-10-CM | POA: Diagnosis not present

## 2018-03-03 DIAGNOSIS — C8522 Mediastinal (thymic) large B-cell lymphoma, intrathoracic lymph nodes: Secondary | ICD-10-CM

## 2018-03-03 DIAGNOSIS — R918 Other nonspecific abnormal finding of lung field: Secondary | ICD-10-CM | POA: Insufficient documentation

## 2018-03-03 LAB — CBC WITH DIFFERENTIAL/PLATELET
Basophils Absolute: 0 10*3/uL (ref 0.0–0.1)
Basophils Relative: 1 %
EOS PCT: 1 %
Eosinophils Absolute: 0 10*3/uL (ref 0.0–0.5)
HCT: 36.1 % (ref 34.8–46.6)
Hemoglobin: 12.1 g/dL (ref 11.6–15.9)
LYMPHS ABS: 0.7 10*3/uL — AB (ref 0.9–3.3)
Lymphocytes Relative: 14 %
MCH: 31.5 pg (ref 25.1–34.0)
MCHC: 33.5 g/dL (ref 31.5–36.0)
MCV: 94.1 fL (ref 79.5–101.0)
MONO ABS: 0.4 10*3/uL (ref 0.1–0.9)
MONOS PCT: 8 %
Neutro Abs: 3.9 10*3/uL (ref 1.5–6.5)
Neutrophils Relative %: 76 %
PLATELETS: 242 10*3/uL (ref 145–400)
RBC: 3.84 MIL/uL (ref 3.70–5.45)
RDW: 14.7 % — AB (ref 11.2–14.5)
WBC: 5 10*3/uL (ref 3.9–10.3)

## 2018-03-03 LAB — COMPREHENSIVE METABOLIC PANEL
ALT: 13 U/L (ref 0–44)
ANION GAP: 9 (ref 5–15)
AST: 20 U/L (ref 15–41)
Albumin: 4.7 g/dL (ref 3.5–5.0)
Alkaline Phosphatase: 58 U/L (ref 38–126)
BUN: 10 mg/dL (ref 6–20)
CHLORIDE: 103 mmol/L (ref 98–111)
CO2: 28 mmol/L (ref 22–32)
CREATININE: 0.79 mg/dL (ref 0.44–1.00)
Calcium: 10.2 mg/dL (ref 8.9–10.3)
Glucose, Bld: 79 mg/dL (ref 70–99)
POTASSIUM: 4.1 mmol/L (ref 3.5–5.1)
Sodium: 140 mmol/L (ref 135–145)
Total Bilirubin: 0.8 mg/dL (ref 0.3–1.2)
Total Protein: 8.2 g/dL — ABNORMAL HIGH (ref 6.5–8.1)

## 2018-03-03 LAB — LACTATE DEHYDROGENASE: LDH: 123 U/L (ref 98–192)

## 2018-03-03 MED ORDER — CYCLOBENZAPRINE HCL 10 MG PO TABS: 10.0000 mg | ORAL_TABLET | Freq: Three times a day (TID) | ORAL | 0 refills | Status: AC | PRN

## 2018-03-03 NOTE — Telephone Encounter (Signed)
Gave patient avs.  Patient did not need calendar.  °

## 2018-03-03 NOTE — Telephone Encounter (Signed)
CT chest scheduled for 9/13 at 0900.  LVM instructing pt on time and location, NPO x 4 hrs and to call our office with any questions or issues.

## 2018-03-03 NOTE — Assessment & Plan Note (Signed)
Her last imaging study in February showed no signs of relapse She has new onset of left neck pain radiating to her shoulder over the past few weeks I recommend CT imaging to exclude cancer recurrence and she agreed to proceed

## 2018-03-03 NOTE — Assessment & Plan Note (Signed)
It is not clear whether the left neck pain is related to recent dental extraction versus cancer recurrence I recommend muscle relaxant while waiting for CT imaging

## 2018-03-03 NOTE — Progress Notes (Signed)
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 CT Chest W Contrast    Standing Status:   Future    Standing Expiration Date:   03/03/2019    Order Specific Question:   If indicated for the ordered procedure, I authorize the administration of contrast media per Radiology protocol    Answer:   Yes    Order Specific Question:   Is patient pregnant?    Answer:   No    Comments:   neg urine pregnancy    Order Specific Question:   Preferred imaging location?    Answer:   [facility]    Order Specific Question:   Radiology Contrast Protocol - do NOT remove file path    Answer:   [email or web address]    INTERVAL HISTORY: Please see below for problem oriented charting. She returns for cancer follow-up She stated she started to have experience left-sided neck pain.  She had recent dental extraction not long ago but the neck pain is new She denies new lymphadenopathy No recent cough, chest pain or shortness of breath Her appetite is stable.  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 Given the appearance and patient's age, this may represent an unusually  prominent hyperplastic thymus, versus thymoma, lymphoma, etc. Assuming prior exams are not available to confirm stability, recommend chest MR with and without contrast including in and out of phase imaging for further evaluation    09/14/2014 Imaging    MR chest at [facility] showed partially solid, partially cystic anterior mediastinal mass. The cystic component is new compared to the prior study 07/07/2014 and has mural nodularity. This may represent internal hemorrhage. The cystic component mural nodularity. The chemical shift ratio does not favor benign thymic hyperplasia. The differential is led by cystic thymoma. Lymphoma and germ cell tumor are also considerations, but felt less likely. If tissue diagnosis is not obtained, recommend 3 month follow-up thoracic MRI with contrast material.    12/21/2014 Imaging    MR chest at [facility] showed a soft tissue mass within the prevascular space of the anterior mediastinum. Note that this examination is limited due to suboptimal technique and the lack of intravenous contrast. Within this context, this lesion is stable to perhaps slightly decreased in size from the comparison study.The previously seen cystic component of this lesion is no longer as well identified, however this is most likely due to suboptimal technique.    04/10/2015 Imaging    CT chest no evidence of pulmonary embolus or right heart strain. Prominent central pulmonary artery trunk with no evidence of central pulmonary artery enlargement. Large anterior and right-sided mediastinal mass. Differential diagnosis includes thymoma, teratoma, lymphoma, and less  likely extra medullary hematopoiesis Small right greater than left pleural effusions and peribronchial streaky opacities bilateral lower lobes subsegmental atelectasis or infiltrate    04/27/2015 Imaging    MR chest showed enlarging bilobed enhancing anterior mediastinal mass with central necrosis within the right-sided component. Broad abutment  of the pericardium is concerning for possible invasion. There is possible encasement of the right mammary vessels. No definite evidence of vascular invasion of the SVC, left brachiocephalic vein, or mammary vessels despite the close proximity of the mass. Based on the patient's age, this remains concerning for germ cell tumor, thymoma, or lymphoma. Consider further evaluation with direct tissue sampling and germ cell tumor markers.    05/04/2015 PET scan    PET scan at [facility] showed hypermetabolic bilobed anterior mediastinal mass with central necrosis in the right sided component is concerning for malignancy with differential including germ cell tumor, thymoma/thymic carcinoma, or lymphoma. There is no evidence of FDG avid disease elsewhere in the chest, abdomen, or pelvis.    05/16/2015 Pathology Results    Mediastinal LN biopsy: The sections show a diffuse proliferation of medium to large-sized lymphoid cells with round and irregular nuclear contours and abundant eosinophilic to clear cytoplasm. Occasional multi-lobated cells are seen. Scattered mitotic figures and apoptotic bodies are present. There are thin bands of compartmentalizing fibrosis within the infiltrate in addition to thick bands of collagen fibrosis. After the initial morphologic evaluation, immunohistochemical stains are performed on block C1 (control tissue reacts properly). The abnormal lymphoid cells are B-cells, which stain with CD20, PAX-5 and CD79a. The abnormal cells are positive for CD23 and MUM-1 and are negative for CD10. CD3 and CD5 highlight many intermixed small T-cells. Approximately 10-20% of the cells express BCL-6, while the majority appear negative for BCL-2. The Ki-67 proliferation index is approximately 30-40%. The cytokeratin AE1/AE3 stain highlights background mesothelial cells. CD30 shows patchy, weak staining of the abnormal lymphoid cells, while CD15 appears to highlight granulocytic cells.         05/25/2015  Initial Diagnosis    She was seen at [facility]    05/30/2015 Imaging    Outside [facility] showed normal left ventricular wall motion. The left ventricular ejection fraction is 66.2%.    06/01/2015 Bone Marrow Biopsy    Outside bone marrow biopsy was negative    06/13/2015 - 09/14/2015 Chemotherapy    The patient had 5 cycles of RCHOP chemotherapy treatment at [facility]      07/22/2015 Imaging    CT chest showed residual solid anterior mediastinal mass, decreased in size from recent PET/CT. Residual or recurrent tumor is not excluded.    08/12/2015 PET scan    Decreased metabolic activity of the anterior mediastinal lymph nodes. No new sites of hypermetabolic disease. ([place] 3). Mild radiotracer uptake in the right upper anterior chest wall skin, which is likely related to focus of inflammation or sequelae of trauma.    09/29/2015 PET scan    Interval slight decrease in FDG activity in the dominant anterior mediastinal mass. A smaller adjacent mediastinal mass is unchanged from prior study. No evidence of new FDG avid disease ([place] 3). Interval decrease in FDG activity in the superficial tissues of the right anterior chest wall, favored to represent inflammatory change.    11/08/2015 - 12/09/2015 Radiation Therapy    She received radiation treatment to anterior Mediastinum treated to 41.40 Gy in 23 fractions    02/21/2016 PET scan    Since the PET of 09/28/2015, decreased size of an anterior mediastinal  mass with unchanged hypermetabolism, similar to the mediastinal pool. ([place] 2). No this new sites of disease identified    07/20/2016 Imaging    CT chest showed partially calcified anterior mediastinal mass is decreased in size when compared with study from 07/22/2015 and 02/21/2016. 2. 3 mm nodule in the right upper lobe is identified. Not definitely seen on previous exam which may reflect differences in technique. Attention on follow-up imaging advise. 3. New paramediastinal fibrotic changes  within both upper lobes compatible with changes of external beam radiation.    08/22/2017 Imaging    1. Slight decrease in volume of previously partially calcified anterior mediastinal mass compatible with treated tumor. 2. No new sites of disease. 3. 4 mm right upper lobe lung nodule, unchanged.     REVIEW OF SYSTEMS:   Constitutional: Denies fevers, chills or abnormal weight loss Eyes: Denies blurriness of vision Ears, nose, mouth, throat, and face: Denies mucositis or sore throat Respiratory: Denies cough, dyspnea or wheezes Cardiovascular: Denies palpitation, chest discomfort or lower extremity swelling Gastrointestinal:  Denies nausea, heartburn or change in bowel habits Skin: Denies abnormal skin rashes Lymphatics: Denies new lymphadenopathy or easy bruising Neurological:Denies numbness, tingling or new weaknesses Behavioral/Psych: Mood is stable, no new changes  All other systems were reviewed with the patient and are negative.  I have reviewed the past medical history, past surgical history, social history and family history with the patient and they are unchanged from previous note.  ALLERGIES:  has No Known Allergies.  MEDICATIONS:  Current Outpatient Medications  Medication Sig Dispense Refill  . acetaminophen (TYLENOL) 500 MG tablet Take 1,000 mg by mouth daily as needed (PAIN).    Marland Kitchen cyclobenzaprine (FLEXERIL) 10 MG tablet Take 1 tablet (10 mg total) by mouth 3 (three) times daily as needed for muscle spasms. 30 tablet 0  . ibuprofen (ADVIL,MOTRIN) 200 MG tablet Take 600 mg by mouth daily as needed (PAIN).     No current facility-administered medications for this visit.     PHYSICAL EXAMINATION: ECOG PERFORMANCE STATUS: 1 - Symptomatic but completely ambulatory  Vitals:   03/03/18 1018  BP: 138/90  Pulse: 92  Resp: 18  Temp: 97.7 F (36.5 C)  SpO2: 100%   Filed Weights   03/03/18 1018  Weight: 115 lb 9.6 oz (52.4 kg)    GENERAL:alert, no distress and  comfortable SKIN: skin color, texture, turgor are normal, no rashes or significant lesions EYES: normal, Conjunctiva are pink and non-injected, sclera clear OROPHARYNX:no exudate, no erythema and lips, buccal mucosa, and tongue normal  NECK: supple, thyroid normal size, non-tender, without nodularity LYMPH:  no palpable lymphadenopathy in the cervical, axillary or inguinal LUNGS: clear to auscultation and percussion with normal breathing effort HEART: regular rate & rhythm and no murmurs and no lower extremity edema ABDOMEN:abdomen soft, non-tender and normal bowel sounds Musculoskeletal:no cyanosis of digits and no clubbing  NEURO: alert & oriented x 3 with fluent speech, no focal motor/sensory deficits  LABORATORY DATA:  I have reviewed the data as listed    Component Value Date/Time   NA 140 03/03/2018 0953   NA 139 04/08/2017 0912   K 4.1 03/03/2018 0953   K 3.1 (L) 04/08/2017 0912   CL 103 03/03/2018 0953   CO2 28 03/03/2018 0953   CO2 25 04/08/2017 0912   GLUCOSE 79 03/03/2018 0953   GLUCOSE 55 (L) 04/08/2017 0912   BUN 10 03/03/2018 0953   BUN 9.4 04/08/2017 0912   CREATININE 0.79 03/03/2018 0953  CREATININE 0.9 04/08/2017 0912   CALCIUM 10.2 03/03/2018 0953   CALCIUM 9.8 04/08/2017 0912   PROT 8.2 (H) 03/03/2018 0953   PROT 7.8 04/08/2017 0912   ALBUMIN 4.7 03/03/2018 0953   ALBUMIN 4.4 04/08/2017 0912   AST 20 03/03/2018 0953   AST 21 04/08/2017 0912   ALT 13 03/03/2018 0953   ALT 19 04/08/2017 0912   ALKPHOS 58 03/03/2018 0953   ALKPHOS 71 04/08/2017 0912   BILITOT 0.8 03/03/2018 0953   BILITOT 0.61 04/08/2017 0912   GFRNONAA >60 03/03/2018 0953   GFRAA >60 03/03/2018 0953    No results found for: SPEP, UPEP  Lab Results  Component Value Date   WBC 5.0 03/03/2018   NEUTROABS 3.9 03/03/2018   HGB 12.1 03/03/2018   HCT 36.1 03/03/2018   MCV 94.1 03/03/2018   PLT 242 03/03/2018      Chemistry      Component Value Date/Time   NA 140 03/03/2018 0953    NA 139 04/08/2017 0912   K 4.1 03/03/2018 0953   K 3.1 (L) 04/08/2017 0912   CL 103 03/03/2018 0953   CO2 28 03/03/2018 0953   CO2 25 04/08/2017 0912   BUN 10 03/03/2018 0953   BUN 9.4 04/08/2017 0912   CREATININE 0.79 03/03/2018 0953   CREATININE 0.9 04/08/2017 0912      Component Value Date/Time   CALCIUM 10.2 03/03/2018 0953   CALCIUM 9.8 04/08/2017 0912   ALKPHOS 58 03/03/2018 0953   ALKPHOS 71 04/08/2017 0912   AST 20 03/03/2018 0953   AST 21 04/08/2017 0912   ALT 13 03/03/2018 0953   ALT 19 04/08/2017 0912   BILITOT 0.8 03/03/2018 0953   BILITOT 0.61 04/08/2017 0912       All questions were answered. The patient knows to call the clinic with any problems, questions or concerns. No barriers to learning was detected.  I spent 15 minutes counseling the patient face to face. The total time spent in the appointment was 20 minutes and more than 50% was on counseling and review of test results  Heath Lark, MD 03/03/2018 2:51 PM

## 2018-03-07 ENCOUNTER — Ambulatory Visit (HOSPITAL_COMMUNITY): Payer: Medicare Other

## 2018-03-10 ENCOUNTER — Telehealth: Payer: Self-pay

## 2018-03-10 ENCOUNTER — Ambulatory Visit (HOSPITAL_COMMUNITY)
Admission: RE | Admit: 2018-03-10 | Discharge: 2018-03-10 | Disposition: A | Discharge status: Home / Self Care | Payer: Medicare Other | Source: Ambulatory Visit | Attending: Hematology and Oncology | Admitting: Hematology and Oncology

## 2018-03-10 ENCOUNTER — Encounter (HOSPITAL_COMMUNITY): Payer: Self-pay

## 2018-03-10 DIAGNOSIS — R911 Solitary pulmonary nodule: Secondary | ICD-10-CM | POA: Diagnosis not present

## 2018-03-10 DIAGNOSIS — C8590 Non-Hodgkin lymphoma, unspecified, unspecified site: Secondary | ICD-10-CM | POA: Diagnosis not present

## 2018-03-10 DIAGNOSIS — C8522 Mediastinal (thymic) large B-cell lymphoma, intrathoracic lymph nodes: Secondary | ICD-10-CM | POA: Insufficient documentation

## 2018-03-10 MED ORDER — IOHEXOL 300 MG/ML  SOLN
75.0000 mL | Freq: Once | INTRAMUSCULAR | Status: AC | PRN
  Administered 2018-03-10: 75 mL via INTRAVENOUS

## 2018-03-10 NOTE — Telephone Encounter (Signed)
-----   Message from Heath Lark, MD sent at 03/10/2018  4:37 PM EDT ----- Regarding: Ct normal pls call her. CT chest is normal If OK with her, cancel appt tomorrow and reschedule follow-up with labs only in 3 months

## 2018-03-10 NOTE — Telephone Encounter (Signed)
Called and given below message. She verbalized understanding. Scheduling message sent. 

## 2018-03-11 ENCOUNTER — Inpatient Hospital Stay: Payer: Medicare Other | Admitting: Hematology and Oncology

## 2018-03-11 ENCOUNTER — Telehealth: Payer: Self-pay | Admitting: Hematology and Oncology

## 2018-03-11 NOTE — Telephone Encounter (Signed)
Mailed pt calendar of upcoming appts per 9/16 sch message  °

## 2018-03-31 DIAGNOSIS — M542 Cervicalgia: Secondary | ICD-10-CM | POA: Diagnosis not present

## 2018-03-31 DIAGNOSIS — S46812A Strain of other muscles, fascia and tendons at shoulder and upper arm level, left arm, initial encounter: Secondary | ICD-10-CM | POA: Diagnosis not present

## 2018-03-31 DIAGNOSIS — M7522 Bicipital tendinitis, left shoulder: Secondary | ICD-10-CM | POA: Diagnosis not present

## 2018-03-31 DIAGNOSIS — Z87891 Personal history of nicotine dependence: Secondary | ICD-10-CM | POA: Diagnosis not present

## 2018-03-31 DIAGNOSIS — S46811A Strain of other muscles, fascia and tendons at shoulder and upper arm level, right arm, initial encounter: Secondary | ICD-10-CM | POA: Diagnosis not present

## 2018-03-31 DIAGNOSIS — X58XXXA Exposure to other specified factors, initial encounter: Secondary | ICD-10-CM | POA: Diagnosis not present

## 2018-03-31 DIAGNOSIS — Z8572 Personal history of non-Hodgkin lymphomas: Secondary | ICD-10-CM | POA: Diagnosis not present

## 2018-06-09 ENCOUNTER — Inpatient Hospital Stay: Payer: Medicare Other | Admitting: Hematology and Oncology

## 2018-06-09 ENCOUNTER — Other Ambulatory Visit: Payer: Self-pay | Admitting: Hematology and Oncology

## 2018-06-09 ENCOUNTER — Inpatient Hospital Stay: Payer: Medicare Other | Attending: Hematology and Oncology

## 2018-06-09 DIAGNOSIS — C8522 Mediastinal (thymic) large B-cell lymphoma, intrathoracic lymph nodes: Secondary | ICD-10-CM

## 2018-06-10 ENCOUNTER — Encounter: Payer: Self-pay | Admitting: Hematology and Oncology

## 2018-06-13 ENCOUNTER — Telehealth: Payer: Self-pay | Admitting: Hematology and Oncology

## 2018-06-13 NOTE — Telephone Encounter (Signed)
R/s appt per 12/19 sch message - pt is aware of appt date and time

## 2018-06-30 ENCOUNTER — Inpatient Hospital Stay (HOSPITAL_BASED_OUTPATIENT_CLINIC_OR_DEPARTMENT_OTHER): Payer: Medicare Other | Admitting: Hematology and Oncology

## 2018-06-30 ENCOUNTER — Inpatient Hospital Stay: Payer: Medicare Other | Attending: Hematology and Oncology

## 2018-06-30 ENCOUNTER — Telehealth: Payer: Self-pay | Admitting: Hematology and Oncology

## 2018-06-30 VITALS — BP 107/82 | HR 96 | Temp 98.1°F | Resp 18 | Ht 62.0 in | Wt 116.0 lb

## 2018-06-30 DIAGNOSIS — Z923 Personal history of irradiation: Secondary | ICD-10-CM | POA: Insufficient documentation

## 2018-06-30 DIAGNOSIS — Z9221 Personal history of antineoplastic chemotherapy: Secondary | ICD-10-CM

## 2018-06-30 DIAGNOSIS — N946 Dysmenorrhea, unspecified: Secondary | ICD-10-CM | POA: Insufficient documentation

## 2018-06-30 DIAGNOSIS — N926 Irregular menstruation, unspecified: Secondary | ICD-10-CM

## 2018-06-30 DIAGNOSIS — Z23 Encounter for immunization: Secondary | ICD-10-CM | POA: Insufficient documentation

## 2018-06-30 DIAGNOSIS — Z79899 Other long term (current) drug therapy: Secondary | ICD-10-CM | POA: Diagnosis not present

## 2018-06-30 DIAGNOSIS — D649 Anemia, unspecified: Secondary | ICD-10-CM

## 2018-06-30 DIAGNOSIS — C8522 Mediastinal (thymic) large B-cell lymphoma, intrathoracic lymph nodes: Secondary | ICD-10-CM | POA: Diagnosis not present

## 2018-06-30 DIAGNOSIS — Z0000 Encounter for general adult medical examination without abnormal findings: Secondary | ICD-10-CM

## 2018-06-30 DIAGNOSIS — Z1239 Encounter for other screening for malignant neoplasm of breast: Secondary | ICD-10-CM

## 2018-06-30 LAB — CBC WITH DIFFERENTIAL/PLATELET
ABS IMMATURE GRANULOCYTES: 0.02 10*3/uL (ref 0.00–0.07)
BASOS ABS: 0 10*3/uL (ref 0.0–0.1)
Basophils Relative: 0 %
Eosinophils Absolute: 0.1 10*3/uL (ref 0.0–0.5)
Eosinophils Relative: 2 %
HCT: 34.5 % — ABNORMAL LOW (ref 36.0–46.0)
HEMOGLOBIN: 11.8 g/dL — AB (ref 12.0–15.0)
Immature Granulocytes: 0 %
LYMPHS PCT: 15 %
Lymphs Abs: 1.1 10*3/uL (ref 0.7–4.0)
MCH: 31.7 pg (ref 26.0–34.0)
MCHC: 34.2 g/dL (ref 30.0–36.0)
MCV: 92.7 fL (ref 80.0–100.0)
MONO ABS: 0.3 10*3/uL (ref 0.1–1.0)
Monocytes Relative: 5 %
NEUTROS ABS: 5.4 10*3/uL (ref 1.7–7.7)
NEUTROS PCT: 78 %
Platelets: 238 10*3/uL (ref 150–400)
RBC: 3.72 MIL/uL — AB (ref 3.87–5.11)
RDW: 13.1 % (ref 11.5–15.5)
WBC: 6.9 10*3/uL (ref 4.0–10.5)
nRBC: 0 % (ref 0.0–0.2)

## 2018-06-30 LAB — COMPREHENSIVE METABOLIC PANEL
ALBUMIN: 4.2 g/dL (ref 3.5–5.0)
ALK PHOS: 61 U/L (ref 38–126)
ALT: 18 U/L (ref 0–44)
AST: 20 U/L (ref 15–41)
Anion gap: 8 (ref 5–15)
BUN: 8 mg/dL (ref 6–20)
CALCIUM: 9.5 mg/dL (ref 8.9–10.3)
CO2: 27 mmol/L (ref 22–32)
CREATININE: 0.93 mg/dL (ref 0.44–1.00)
Chloride: 106 mmol/L (ref 98–111)
GFR calc Af Amer: >60 mL/min (ref >60)
GFR calc non Af Amer: >60 mL/min (ref >60)
GLUCOSE: 94 mg/dL (ref 70–99)
Potassium: 4.4 mmol/L (ref 3.5–5.1)
SODIUM: 141 mmol/L (ref 135–145)
Total Bilirubin: 0.5 mg/dL (ref 0.3–1.2)
Total Protein: 7.7 g/dL (ref 6.5–8.1)

## 2018-06-30 LAB — TSH: TSH: 0.388 u[IU]/mL (ref 0.308–3.960)

## 2018-06-30 LAB — LACTATE DEHYDROGENASE: LDH: 117 U/L (ref 98–192)

## 2018-06-30 MED ORDER — INFLUENZA VAC SPLIT QUAD 0.5 ML IM SUSY
0.5000 mL | PREFILLED_SYRINGE | Freq: Once | INTRAMUSCULAR | Status: AC
  Administered 2018-06-30: 0.5 mL via INTRAMUSCULAR

## 2018-06-30 MED ORDER — INFLUENZA VAC SPLIT QUAD 0.5 ML IM SUSY
PREFILLED_SYRINGE | INTRAMUSCULAR | Status: AC
  Filled 2018-06-30: qty 0.5

## 2018-06-30 NOTE — Telephone Encounter (Signed)
Gave avs and calendar ° °

## 2018-07-01 ENCOUNTER — Encounter: Payer: Self-pay | Admitting: Hematology and Oncology

## 2018-07-01 DIAGNOSIS — D649 Anemia, unspecified: Secondary | ICD-10-CM | POA: Insufficient documentation

## 2018-07-01 NOTE — Assessment & Plan Note (Addendum)
She has mild anemia, cause unknown.  The patient denies recent history of bleeding such as epistaxis, hematuria or hematochezia. She is asymptomatic from the anemia. We will observe for now.

## 2018-07-01 NOTE — Assessment & Plan Note (Signed)
Her breast examination is benign Her has mammogram was in May 2019.  She is due for repeat mammogram again in 4 months

## 2018-07-01 NOTE — Assessment & Plan Note (Signed)
Clinically, I cannot palpate any abnormalities However, due to her symptoms, I recommend CT scan of the chest for evaluation and to rule out cancer recurrence

## 2018-07-01 NOTE — Assessment & Plan Note (Signed)
She had recent irregular menstruation.  TSH is within normal limits.  Observe only for now

## 2018-07-01 NOTE — Progress Notes (Signed)
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  She is due for repeat mammogram again in 4 months  Anemia, mild She has mild anemia, cause unknown.  The patient denies recent history of bleeding such as epistaxis, hematuria or hematochezia. She is asymptomatic from the anemia. We will observe for now.    Dysmenorrhea, unspecified She had recent irregular menstruation.  TSH is within normal limits.  Observe only for now   Orders Placed This Encounter  Procedures  . CT CHEST W CONTRAST    Standing Status:   Future    Standing Expiration Date:   07/01/2019    Order Specific Question:   If indicated for the ordered procedure, I authorize the administration of contrast media per Radiology protocol    Answer:   Yes    Order Specific Question:   Preferred imaging location?    Answer:   [facility]    Order Specific Question:   Radiology Contrast Protocol - do NOT remove file path    Answer:   [email or web address]    Order Specific Question:   Is patient pregnant?    Answer:   No  . TSH    Standing Status:   Future    Number of Occurrences:   1    Standing Expiration Date:   08/04/2019  . CBC with Differential/Platelet    Standing Status:   Future    Standing Expiration Date:   08/05/2019  . Comprehensive metabolic panel    Standing Status:   Future    Standing Expiration Date:   08/05/2019  . Lactate dehydrogenase    Standing Status:   Future    Standing Expiration Date:   07/02/2019    INTERVAL HISTORY: Please see below for problem  oriented charting. She returns for further follow-up She denies recent lymphadenopathy She complained of right sided retrosternal chest discomfort that comes and goes Denies cough or shortness of breath She complained of irregular menstruation recently No recent infection, fever or chills.  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 Given the appearance and patient's age, this may represent an unusually prominent hyperplastic thymus, versus thymoma, lymphoma, etc. Assuming prior exams are not available to confirm stability, recommend chest MR with and without contrast including in and out of phase imaging for further evaluation    09/14/2014 Imaging    MR chest at [facility] showed partially solid, partially cystic anterior mediastinal mass. The cystic component is new compared to the prior study 07/07/2014 and has mural nodularity. This may represent internal hemorrhage. The cystic component mural nodularity. The chemical shift ratio does not favor benign thymic hyperplasia. The differential is led by cystic thymoma. Lymphoma and germ cell tumor are also considerations, but felt less likely. If tissue diagnosis is not obtained, recommend 3 month follow-up thoracic MRI with contrast material.    12/21/2014 Imaging    MR chest at [facility] showed a soft tissue mass within  the prevascular space of the anterior mediastinum. Note that this examination is limited due to suboptimal technique and the lack of intravenous contrast. Within this context, this lesion is stable to perhaps slightly decreased in size from the comparison study.The previously seen cystic component of this lesion is no longer as well identified, however this is most likely due to suboptimal technique.    04/10/2015 Imaging    CT chest no evidence of pulmonary embolus or right heart  strain. Prominent central pulmonary artery trunk with no evidence of central pulmonary artery enlargement. Large anterior and right-sided mediastinal mass. Differential diagnosis includes thymoma, teratoma, lymphoma, and less likely extra medullary hematopoiesis Small right greater than left pleural effusions and peribronchial streaky opacities bilateral lower lobes subsegmental atelectasis or infiltrate    04/27/2015 Imaging    MR chest showed enlarging bilobed enhancing anterior mediastinal mass with central necrosis within the right-sided component. Broad abutment of the pericardium is concerning for possible invasion. There is possible encasement of the right mammary vessels. No definite evidence of vascular invasion of the SVC, left brachiocephalic vein, or mammary vessels despite the close proximity of the mass. Based on the patient's age, this remains concerning for germ cell tumor, thymoma, or lymphoma. Consider further evaluation with direct tissue sampling and germ cell tumor markers.    05/04/2015 PET scan    PET scan at [facility] showed hypermetabolic bilobed anterior mediastinal mass with central necrosis in the right sided component is concerning for malignancy with differential including germ cell tumor, thymoma/thymic carcinoma, or lymphoma. There is no evidence of FDG avid disease elsewhere in the chest, abdomen, or pelvis.    05/16/2015 Pathology Results    Mediastinal LN biopsy: The sections show a diffuse proliferation of medium to large-sized lymphoid cells with round and irregular nuclear contours and abundant eosinophilic to clear cytoplasm. Occasional multi-lobated cells are seen. Scattered mitotic figures and apoptotic bodies are present. There are thin bands of compartmentalizing fibrosis within the infiltrate in addition to thick bands of collagen fibrosis. After the initial morphologic evaluation, immunohistochemical stains are performed on block C1 (control tissue reacts properly). The  abnormal lymphoid cells are B-cells, which stain with CD20, PAX-5 and CD79a. The abnormal cells are positive for CD23 and MUM-1 and are negative for CD10. CD3 and CD5 highlight many intermixed small T-cells. Approximately 10-20% of the cells express BCL-6, while the majority appear negative for BCL-2. The Ki-67 proliferation index is approximately 30-40%. The cytokeratin AE1/AE3 stain highlights background mesothelial cells. CD30 shows patchy, weak staining of the abnormal lymphoid cells, while CD15 appears to highlight granulocytic cells.         05/25/2015 Initial Diagnosis    She was seen at [facility]    05/30/2015 Imaging    Outside [facility] showed normal left ventricular wall motion. The left ventricular ejection fraction is 66.2%.    06/01/2015 Bone Marrow Biopsy    Outside bone marrow biopsy was negative    06/13/2015 - 09/14/2015 Chemotherapy    The patient had 5 cycles of RCHOP chemotherapy treatment at [facility]      07/22/2015 Imaging    CT chest showed residual solid anterior mediastinal mass, decreased in size from recent PET/CT. Residual or recurrent tumor is not excluded.    08/12/2015 PET scan    Decreased metabolic activity of the anterior mediastinal lymph nodes. No new sites of hypermetabolic disease. ([place] 3). Mild radiotracer uptake in the right upper anterior chest wall skin, which is likely related to focus of inflammation or  sequelae of trauma.    09/29/2015 PET scan    Interval slight decrease in FDG activity in the dominant anterior mediastinal mass. A smaller adjacent mediastinal mass is unchanged from prior study. No evidence of new FDG avid disease ([place] 3). Interval decrease in FDG activity in the superficial tissues of the right anterior chest wall, favored to represent inflammatory change.    11/08/2015 - 12/09/2015 Radiation Therapy    She received radiation treatment to anterior Mediastinum treated to 41.40 Gy in 23 fractions    02/21/2016 PET scan    Since the PET  of 09/28/2015, decreased size of an anterior mediastinal mass with unchanged hypermetabolism, similar to the mediastinal pool. ([place] 2). No this new sites of disease identified    07/20/2016 Imaging    CT chest showed partially calcified anterior mediastinal mass is decreased in size when compared with study from 07/22/2015 and 02/21/2016. 2. 3 mm nodule in the right upper lobe is identified. Not definitely seen on previous exam which may reflect differences in technique. Attention on follow-up imaging advise. 3. New paramediastinal fibrotic changes within both upper lobes compatible with changes of external beam radiation.    08/22/2017 Imaging    1. Slight decrease in volume of previously partially calcified anterior mediastinal mass compatible with treated tumor. 2. No new sites of disease. 3. 4 mm right upper lobe lung nodule, unchanged.    03/10/2018 Imaging    Continued further minimal decrease in size of the partially calcified anterior mediastinal lesion, compatible with treated disease. No new or progressive findings.  Stable tiny right upper lobe pulmonary nodule.     REVIEW OF SYSTEMS:   Constitutional: Denies fevers, chills or abnormal weight loss Eyes: Denies blurriness of vision Ears, nose, mouth, throat, and face: Denies mucositis or sore throat Respiratory: Denies cough, dyspnea or wheezes Cardiovascular: Denies palpitation, chest discomfort or lower extremity swelling Gastrointestinal:  Denies nausea, heartburn or change in bowel habits Skin: Denies abnormal skin rashes Lymphatics: Denies new lymphadenopathy or easy bruising Neurological:Denies numbness, tingling or new weaknesses Behavioral/Psych: Mood is stable, no new changes  All other systems were reviewed with the patient and are negative.  I have reviewed the past medical history, past surgical history, social history and family history with the patient and they are unchanged from previous note.  ALLERGIES:   has No Known Allergies.  MEDICATIONS:  Current Outpatient Medications  Medication Sig Dispense Refill  . acetaminophen (TYLENOL) 500 MG tablet Take 1,000 mg by mouth daily as needed (PAIN).    Marland Kitchen cyclobenzaprine (FLEXERIL) 10 MG tablet Take 1 tablet (10 mg total) by mouth 3 (three) times daily as needed for muscle spasms. 30 tablet 0  . ibuprofen (ADVIL,MOTRIN) 200 MG tablet Take 600 mg by mouth daily as needed (PAIN).     No current facility-administered medications for this visit.     PHYSICAL EXAMINATION: ECOG PERFORMANCE STATUS: 1 - Symptomatic but completely ambulatory  Vitals:   06/30/18 1346  BP: 107/82  Pulse: 96  Resp: 18  Temp: 98.1 F (36.7 C)  SpO2: 100%   Filed Weights   06/30/18 1346  Weight: 116 lb (52.6 kg)    GENERAL:alert, no distress and comfortable SKIN: skin color, texture, turgor are normal, no rashes or significant lesions EYES: normal, Conjunctiva are pink and non-injected, sclera clear OROPHARYNX:no exudate, no erythema and lips, buccal mucosa, and tongue normal  NECK: supple, thyroid normal size, non-tender, without nodularity LYMPH:  no palpable lymphadenopathy in the cervical, axillary or  inguinal LUNGS: clear to auscultation and percussion with normal breathing effort HEART: regular rate & rhythm and no murmurs and no lower extremity edema ABDOMEN:abdomen soft, non-tender and normal bowel sounds Musculoskeletal:no cyanosis of digits and no clubbing  NEURO: alert & oriented x 3 with fluent speech, no focal motor/sensory deficits Bilateral breast examination were performed which showed no abnormalities  LABORATORY DATA:  I have reviewed the data as listed    Component Value Date/Time   NA 141 06/30/2018 1311   NA 139 04/08/2017 0912   K 4.4 06/30/2018 1311   K 3.1 (L) 04/08/2017 0912   CL 106 06/30/2018 1311   CO2 27 06/30/2018 1311   CO2 25 04/08/2017 0912   GLUCOSE 94 06/30/2018 1311   GLUCOSE 55 (L) 04/08/2017 0912   BUN 8  06/30/2018 1311   BUN 9.4 04/08/2017 0912   CREATININE 0.93 06/30/2018 1311   CREATININE 0.9 04/08/2017 0912   CALCIUM 9.5 06/30/2018 1311   CALCIUM 9.8 04/08/2017 0912   PROT 7.7 06/30/2018 1311   PROT 7.8 04/08/2017 0912   ALBUMIN 4.2 06/30/2018 1311   ALBUMIN 4.4 04/08/2017 0912   AST 20 06/30/2018 1311   AST 21 04/08/2017 0912   ALT 18 06/30/2018 1311   ALT 19 04/08/2017 0912   ALKPHOS 61 06/30/2018 1311   ALKPHOS 71 04/08/2017 0912   BILITOT 0.5 06/30/2018 1311   BILITOT 0.61 04/08/2017 0912   GFRNONAA >60 06/30/2018 1311   GFRAA >60 06/30/2018 1311    No results found for: SPEP, UPEP  Lab Results  Component Value Date   WBC 6.9 06/30/2018   NEUTROABS 5.4 06/30/2018   HGB 11.8 (L) 06/30/2018   HCT 34.5 (L) 06/30/2018   MCV 92.7 06/30/2018   PLT 238 06/30/2018      Chemistry      Component Value Date/Time   NA 141 06/30/2018 1311   NA 139 04/08/2017 0912   K 4.4 06/30/2018 1311   K 3.1 (L) 04/08/2017 0912   CL 106 06/30/2018 1311   CO2 27 06/30/2018 1311   CO2 25 04/08/2017 0912   BUN 8 06/30/2018 1311   BUN 9.4 04/08/2017 0912   CREATININE 0.93 06/30/2018 1311   CREATININE 0.9 04/08/2017 0912      Component Value Date/Time   CALCIUM 9.5 06/30/2018 1311   CALCIUM 9.8 04/08/2017 0912   ALKPHOS 61 06/30/2018 1311   ALKPHOS 71 04/08/2017 0912   AST 20 06/30/2018 1311   AST 21 04/08/2017 0912   ALT 18 06/30/2018 1311   ALT 19 04/08/2017 0912   BILITOT 0.5 06/30/2018 1311   BILITOT 0.61 04/08/2017 0912      All questions were answered. The patient knows to call the clinic with any problems, questions or concerns. No barriers to learning was detected.  I spent 15 minutes counseling the patient face to face. The total time spent in the appointment was 20 minutes and more than 50% was on counseling and review of test results  Heath Lark, MD 07/01/2018 2:08 PM

## 2018-07-08 ENCOUNTER — Telehealth: Payer: Self-pay | Admitting: *Deleted

## 2018-07-08 ENCOUNTER — Ambulatory Visit (HOSPITAL_COMMUNITY)
Admission: RE | Admit: 2018-07-08 | Discharge: 2018-07-08 | Disposition: A | Discharge status: Home / Self Care | Payer: Medicare Other | Source: Ambulatory Visit | Attending: Hematology and Oncology | Admitting: Hematology and Oncology

## 2018-07-08 DIAGNOSIS — C8522 Mediastinal (thymic) large B-cell lymphoma, intrathoracic lymph nodes: Secondary | ICD-10-CM | POA: Diagnosis not present

## 2018-07-08 DIAGNOSIS — Z8572 Personal history of non-Hodgkin lymphomas: Secondary | ICD-10-CM | POA: Diagnosis not present

## 2018-07-08 MED ORDER — IOHEXOL 300 MG/ML  SOLN
75.0000 mL | Freq: Once | INTRAMUSCULAR | Status: AC | PRN
  Administered 2018-07-08: 75 mL via INTRAVENOUS

## 2018-07-08 MED ORDER — SODIUM CHLORIDE (PF) 0.9 % IJ SOLN
INTRAMUSCULAR | Status: AC
  Filled 2018-07-08: qty 50

## 2018-07-08 NOTE — Telephone Encounter (Signed)
Telephone call to patient on home number- phone rings no answer/voicemail. Tried 3 times.  Will attempt again tomorrow.

## 2018-07-08 NOTE — Telephone Encounter (Signed)
-----   Message from Heath Lark, MD sent at 07/08/2018 10:48 AM EST ----- Regarding: Ct chest normal CT is negative Please let her know her test results

## 2018-07-09 NOTE — Telephone Encounter (Signed)
Called and given below message. She verbalized understanding. 

## 2018-11-26 DIAGNOSIS — R6884 Jaw pain: Secondary | ICD-10-CM | POA: Diagnosis not present

## 2018-12-29 ENCOUNTER — Inpatient Hospital Stay: Payer: TRICARE For Life (TFL) | Attending: Hematology and Oncology | Admitting: Hematology and Oncology

## 2018-12-29 ENCOUNTER — Inpatient Hospital Stay: Payer: TRICARE For Life (TFL)

## 2018-12-29 ENCOUNTER — Encounter: Payer: Self-pay | Admitting: Hematology and Oncology

## 2019-01-29 ENCOUNTER — Telehealth: Payer: Self-pay | Admitting: Hematology and Oncology

## 2019-01-29 NOTE — Telephone Encounter (Signed)
Returned call rescheduling 8/7 lab/fu. Confirmed with patient lab/fu for 8/13.

## 2019-01-30 ENCOUNTER — Inpatient Hospital Stay: Payer: Medicare Other

## 2019-01-30 ENCOUNTER — Inpatient Hospital Stay: Payer: Medicare Other | Admitting: Hematology and Oncology

## 2019-02-05 ENCOUNTER — Encounter: Payer: Self-pay | Admitting: Hematology and Oncology

## 2019-02-05 ENCOUNTER — Other Ambulatory Visit: Payer: Self-pay

## 2019-02-05 ENCOUNTER — Inpatient Hospital Stay: Payer: Medicare Other | Attending: Hematology and Oncology

## 2019-02-05 ENCOUNTER — Inpatient Hospital Stay (HOSPITAL_BASED_OUTPATIENT_CLINIC_OR_DEPARTMENT_OTHER): Payer: Medicare Other | Admitting: Hematology and Oncology

## 2019-02-05 DIAGNOSIS — C8522 Mediastinal (thymic) large B-cell lymphoma, intrathoracic lymph nodes: Secondary | ICD-10-CM

## 2019-02-05 DIAGNOSIS — Z1239 Encounter for other screening for malignant neoplasm of breast: Secondary | ICD-10-CM

## 2019-02-05 DIAGNOSIS — Z79899 Other long term (current) drug therapy: Secondary | ICD-10-CM | POA: Insufficient documentation

## 2019-02-05 DIAGNOSIS — Z923 Personal history of irradiation: Secondary | ICD-10-CM | POA: Insufficient documentation

## 2019-02-05 DIAGNOSIS — Z8572 Personal history of non-Hodgkin lymphomas: Secondary | ICD-10-CM | POA: Diagnosis not present

## 2019-02-05 LAB — CBC WITH DIFFERENTIAL/PLATELET
Abs Immature Granulocytes: 0.01 10*3/uL (ref 0.00–0.07)
Basophils Absolute: 0 10*3/uL (ref 0.0–0.1)
Basophils Relative: 1 %
Eosinophils Absolute: 0.1 10*3/uL (ref 0.0–0.5)
Eosinophils Relative: 2 %
HCT: 36 % (ref 36.0–46.0)
Hemoglobin: 12.2 g/dL (ref 12.0–15.0)
Immature Granulocytes: 0 %
Lymphocytes Relative: 15 %
Lymphs Abs: 0.9 10*3/uL (ref 0.7–4.0)
MCH: 31.9 pg (ref 26.0–34.0)
MCHC: 33.9 g/dL (ref 30.0–36.0)
MCV: 94 fL (ref 80.0–100.0)
Monocytes Absolute: 0.4 10*3/uL (ref 0.1–1.0)
Monocytes Relative: 6 %
Neutro Abs: 4.9 10*3/uL (ref 1.7–7.7)
Neutrophils Relative %: 76 %
Platelets: 254 10*3/uL (ref 150–400)
RBC: 3.83 MIL/uL — ABNORMAL LOW (ref 3.87–5.11)
RDW: 12.8 % (ref 11.5–15.5)
WBC: 6.4 10*3/uL (ref 4.0–10.5)
nRBC: 0 % (ref 0.0–0.2)

## 2019-02-05 LAB — COMPREHENSIVE METABOLIC PANEL
ALT: 26 U/L (ref 0–44)
AST: 27 U/L (ref 15–41)
Albumin: 4.3 g/dL (ref 3.5–5.0)
Alkaline Phosphatase: 63 U/L (ref 38–126)
Anion gap: 10 (ref 5–15)
BUN: 12 mg/dL (ref 6–20)
CO2: 24 mmol/L (ref 22–32)
Calcium: 9.5 mg/dL (ref 8.9–10.3)
Chloride: 104 mmol/L (ref 98–111)
Creatinine, Ser: 0.84 mg/dL (ref 0.44–1.00)
GFR calc Af Amer: >60 mL/min (ref >60)
GFR calc non Af Amer: >60 mL/min (ref >60)
Glucose, Bld: 84 mg/dL (ref 70–99)
Potassium: 4.1 mmol/L (ref 3.5–5.1)
Sodium: 138 mmol/L (ref 135–145)
Total Bilirubin: 0.5 mg/dL (ref 0.3–1.2)
Total Protein: 7.6 g/dL (ref 6.5–8.1)

## 2019-02-05 LAB — LACTATE DEHYDROGENASE: LDH: 112 U/L (ref 98–192)

## 2019-02-05 NOTE — Assessment & Plan Note (Signed)
Due to radiation exposure, she is at risk of breast cancer I recommend the patient to call mammogram center to schedule annual mammogram screening

## 2019-02-05 NOTE — Assessment & Plan Note (Signed)
Clinically, she has no signs of cancer recurrence I do not feel it is beneficial to continue on surveillance imaging study moving forward I will continue history, physical examination and blood work once a year We discussed the importance of influenza vaccination 

## 2019-02-05 NOTE — Progress Notes (Signed)
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   INTERVAL HISTORY: Please see below for problem oriented charting. 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 Given the appearance and patient's age, this may represent an unusually prominent hyperplastic thymus, versus thymoma, lymphoma, etc. Assuming prior exams are not available to confirm stability, recommend chest MR with and without contrast including in and out of phase imaging for further evaluation   09/14/2014 Imaging   MR chest at [facility] showed partially solid, partially cystic anterior mediastinal mass. The cystic component is new compared to the prior study 07/07/2014 and has mural nodularity. This may represent internal hemorrhage. The cystic component mural nodularity. The chemical shift ratio does not favor benign thymic hyperplasia. The differential is led by cystic thymoma.  Lymphoma and germ cell tumor are also considerations, but felt less likely. If tissue diagnosis is not obtained, recommend 3 month follow-up thoracic MRI with contrast material.   12/21/2014 Imaging   MR chest at [facility] showed a soft tissue mass within the prevascular space of the anterior mediastinum. Note that this examination is limited due to suboptimal technique and the lack of intravenous contrast. Within this context, this lesion is stable to perhaps slightly decreased in size from the comparison study.The previously seen cystic component of this lesion is no longer as well identified, however this is most likely due to suboptimal technique.   04/10/2015 Imaging   CT chest no evidence of pulmonary embolus or right heart strain. Prominent central pulmonary artery trunk with no evidence of central pulmonary artery enlargement. Large anterior and right-sided mediastinal mass. Differential diagnosis includes thymoma, teratoma, lymphoma, and less likely extra medullary hematopoiesis Small right greater than left pleural effusions and peribronchial streaky opacities bilateral lower lobes subsegmental atelectasis or infiltrate   04/27/2015 Imaging   MR chest showed enlarging bilobed enhancing anterior mediastinal mass with central necrosis within the right-sided component. Broad abutment of the pericardium is concerning for possible invasion. There is possible encasement of the right mammary vessels. No definite evidence of vascular invasion of the SVC, left brachiocephalic vein, or mammary vessels despite the close proximity of the mass. Based on the patient's age, this remains concerning for germ cell tumor, thymoma, or lymphoma. Consider further evaluation with direct tissue sampling and germ cell tumor markers.   05/04/2015 PET scan   PET scan at [facility] showed hypermetabolic bilobed anterior mediastinal mass with central necrosis in the right sided component is concerning for malignancy with  differential including germ cell tumor, thymoma/thymic  carcinoma, or lymphoma. There is no evidence of FDG avid disease elsewhere in the chest, abdomen, or pelvis.   05/16/2015 Pathology Results   Mediastinal LN biopsy: The sections show a diffuse proliferation of medium to large-sized lymphoid cells with round and irregular nuclear contours and abundant eosinophilic to clear cytoplasm. Occasional multi-lobated cells are seen. Scattered mitotic figures and apoptotic bodies are present. There are thin bands of compartmentalizing fibrosis within the infiltrate in addition to thick bands of collagen fibrosis. After the initial morphologic evaluation, immunohistochemical stains are performed on block C1 (control tissue reacts properly). The abnormal lymphoid cells are B-cells, which stain with CD20, PAX-5 and CD79a. The abnormal cells are positive for CD23 and MUM-1 and are negative for CD10. CD3 and CD5 highlight many intermixed small T-cells. Approximately 10-20% of the cells express BCL-6, while the majority appear negative for BCL-2. The Ki-67 proliferation index is approximately 30-40%. The cytokeratin AE1/AE3 stain highlights background mesothelial cells. CD30 shows patchy, weak staining of the abnormal lymphoid cells, while CD15 appears to highlight granulocytic cells.        05/25/2015 Initial Diagnosis   She was seen at [facility]   05/30/2015 Imaging   Outside [facility] showed normal left ventricular wall motion. The left ventricular ejection fraction is 66.2%.   06/01/2015 Bone Marrow Biopsy   Outside bone marrow biopsy was negative   06/13/2015 - 09/14/2015 Chemotherapy   The patient had 5 cycles of RCHOP chemotherapy treatment [facility]     07/22/2015 Imaging   CT chest showed residual solid anterior mediastinal mass, decreased in size from recent PET/CT. Residual or recurrent tumor is not excluded.   08/12/2015 PET scan   Decreased metabolic activity of the anterior mediastinal lymph nodes. No new  sites of hypermetabolic disease. ([place] 3). Mild radiotracer uptake in the right upper anterior chest wall skin, which is likely related to focus of inflammation or sequelae of trauma.   09/29/2015 PET scan   Interval slight decrease in FDG activity in the dominant anterior mediastinal mass. A smaller adjacent mediastinal mass is unchanged from prior study. No evidence of new FDG avid disease ([place] 3). Interval decrease in FDG activity in the superficial tissues of the right anterior chest wall, favored to represent inflammatory change.   11/08/2015 - 12/09/2015 Radiation Therapy   She received radiation treatment to anterior Mediastinum treated to 41.40 Gy in 23 fractions   02/21/2016 PET scan   Since the PET of 09/28/2015, decreased size of an anterior mediastinal mass with unchanged hypermetabolism, similar to the mediastinal pool. ([place] 2). No this new sites of disease identified   07/20/2016 Imaging   CT chest showed partially calcified anterior mediastinal mass is decreased in size when compared with study from 07/22/2015 and 02/21/2016. 2. 3 mm nodule in the right upper lobe is identified. Not definitely seen on previous exam which may reflect differences in technique. Attention on follow-up imaging advise. 3. New paramediastinal fibrotic changes within both upper lobes compatible with changes of external beam radiation.   08/22/2017 Imaging   1. Slight decrease in volume of previously partially calcified anterior mediastinal mass compatible with treated tumor. 2. No new sites of disease. 3. 4 mm right upper lobe lung nodule, unchanged.   03/10/2018 Imaging   Continued further minimal decrease in size of the partially calcified anterior mediastinal lesion, compatible with treated disease. No new or progressive findings.  Stable tiny right upper lobe pulmonary nodule.   07/08/2018 Imaging   1. Stable partially calcified anterior mediastinal  mass most compatible with treated  lymphoma. 2. No acute process within the chest.     REVIEW OF SYSTEMS:   Constitutional: Denies fevers, chills or abnormal weight loss Eyes: Denies blurriness of vision Ears, nose, mouth, throat, and face: Denies mucositis or sore throat Respiratory: Denies cough, dyspnea or wheezes Cardiovascular: Denies palpitation, chest discomfort or lower extremity swelling Gastrointestinal:  Denies nausea, heartburn or change in bowel habits Skin: Denies abnormal skin rashes Lymphatics: Denies new lymphadenopathy or easy bruising Neurological:Denies numbness, tingling or new weaknesses Behavioral/Psych: Mood is stable, no new changes  All other systems were reviewed with the patient and are negative.  I have reviewed the past medical history, past surgical history, social history and family history with the patient and they are unchanged from previous note.  ALLERGIES:  has No Known Allergies.  MEDICATIONS:  Current Outpatient Medications  Medication Sig Dispense Refill  . acetaminophen (TYLENOL) 500 MG tablet Take 1,000 mg by mouth daily as needed (PAIN).    Marland Kitchen cyclobenzaprine (FLEXERIL) 10 MG tablet Take 1 tablet (10 mg total) by mouth 3 (three) times daily as needed for muscle spasms. 30 tablet 0  . ibuprofen (ADVIL,MOTRIN) 200 MG tablet Take 600 mg by mouth daily as needed (PAIN).     No current facility-administered medications for this visit.     PHYSICAL EXAMINATION: ECOG PERFORMANCE STATUS: 0 - Asymptomatic  Vitals:   02/05/19 0909  BP: 113/62  Pulse: 86  Resp: 18  Temp: 98.7 F (37.1 C)  SpO2: 100%   Filed Weights   02/05/19 0909  Weight: 117 lb 12.8 oz (53.4 kg)    GENERAL:alert, no distress and comfortable SKIN: skin color, texture, turgor are normal, no rashes or significant lesions EYES: normal, Conjunctiva are pink and non-injected, sclera clear OROPHARYNX:no exudate, no erythema and lips, buccal mucosa, and tongue normal  NECK: supple, thyroid normal size,  non-tender, without nodularity LYMPH:  no palpable lymphadenopathy in the cervical, axillary or inguinal LUNGS: clear to auscultation and percussion with normal breathing effort HEART: regular rate & rhythm and no murmurs and no lower extremity edema ABDOMEN:abdomen soft, non-tender and normal bowel sounds Musculoskeletal:no cyanosis of digits and no clubbing  NEURO: alert & oriented x 3 with fluent speech, no focal motor/sensory deficits  LABORATORY DATA:  I have reviewed the data as listed    Component Value Date/Time   NA 138 02/05/2019 0854   NA 139 04/08/2017 0912   K 4.1 02/05/2019 0854   K 3.1 (L) 04/08/2017 0912   CL 104 02/05/2019 0854   CO2 24 02/05/2019 0854   CO2 25 04/08/2017 0912   GLUCOSE 84 02/05/2019 0854   GLUCOSE 55 (L) 04/08/2017 0912   BUN 12 02/05/2019 0854   BUN 9.4 04/08/2017 0912   CREATININE 0.84 02/05/2019 0854   CREATININE 0.9 04/08/2017 0912   CALCIUM 9.5 02/05/2019 0854   CALCIUM 9.8 04/08/2017 0912   PROT 7.6 02/05/2019 0854   PROT 7.8 04/08/2017 0912   ALBUMIN 4.3 02/05/2019 0854   ALBUMIN 4.4 04/08/2017 0912   AST 27 02/05/2019 0854   AST 21 04/08/2017 0912   ALT 26 02/05/2019 0854   ALT 19 04/08/2017 0912   ALKPHOS 63 02/05/2019 0854   ALKPHOS 71 04/08/2017 0912   BILITOT 0.5 02/05/2019 0854   BILITOT 0.61 04/08/2017 0912   GFRNONAA >60 02/05/2019 0854   GFRAA >60 02/05/2019 0854    No results found for: SPEP, UPEP  Lab Results  Component Value Date  WBC 6.4 02/05/2019   NEUTROABS 4.9 02/05/2019   HGB 12.2 02/05/2019   HCT 36.0 02/05/2019   MCV 94.0 02/05/2019   PLT 254 02/05/2019      Chemistry      Component Value Date/Time   NA 138 02/05/2019 0854   NA 139 04/08/2017 0912   K 4.1 02/05/2019 0854   K 3.1 (L) 04/08/2017 0912   CL 104 02/05/2019 0854   CO2 24 02/05/2019 0854   CO2 25 04/08/2017 0912   BUN 12 02/05/2019 0854   BUN 9.4 04/08/2017 0912   CREATININE 0.84 02/05/2019 0854   CREATININE 0.9 04/08/2017 0912       Component Value Date/Time   CALCIUM 9.5 02/05/2019 0854   CALCIUM 9.8 04/08/2017 0912   ALKPHOS 63 02/05/2019 0854   ALKPHOS 71 04/08/2017 0912   AST 27 02/05/2019 0854   AST 21 04/08/2017 0912   ALT 26 02/05/2019 0854   ALT 19 04/08/2017 0912   BILITOT 0.5 02/05/2019 0854   BILITOT 0.61 04/08/2017 0912     All questions were answered. The patient knows to call the clinic with any problems, questions or concerns. No barriers to learning was detected.  I spent 15 minutes counseling the patient face to face. The total time spent in the appointment was 20 minutes and more than 50% was on counseling and review of test results  Heath Lark, MD 02/05/2019 10:49 AM

## 2019-02-06 ENCOUNTER — Telehealth: Payer: Self-pay | Admitting: Hematology and Oncology

## 2019-02-06 NOTE — Telephone Encounter (Signed)
I talk with patient regarding schedule  

## 2019-03-19 DIAGNOSIS — Z3201 Encounter for pregnancy test, result positive: Secondary | ICD-10-CM | POA: Diagnosis not present

## 2019-04-17 DIAGNOSIS — Z3201 Encounter for pregnancy test, result positive: Secondary | ICD-10-CM | POA: Diagnosis not present

## 2019-04-17 DIAGNOSIS — O23591 Infection of other part of genital tract in pregnancy, first trimester: Secondary | ICD-10-CM | POA: Diagnosis not present

## 2019-04-17 DIAGNOSIS — Z3A00 Weeks of gestation of pregnancy not specified: Secondary | ICD-10-CM | POA: Diagnosis not present

## 2019-04-17 DIAGNOSIS — Z3A01 Less than 8 weeks gestation of pregnancy: Secondary | ICD-10-CM | POA: Diagnosis not present

## 2019-04-17 DIAGNOSIS — O4690 Antepartum hemorrhage, unspecified, unspecified trimester: Secondary | ICD-10-CM | POA: Diagnosis not present

## 2019-04-17 DIAGNOSIS — N760 Acute vaginitis: Secondary | ICD-10-CM | POA: Diagnosis not present

## 2019-04-17 DIAGNOSIS — O209 Hemorrhage in early pregnancy, unspecified: Secondary | ICD-10-CM | POA: Diagnosis not present

## 2019-06-12 DIAGNOSIS — Z20828 Contact with and (suspected) exposure to other viral communicable diseases: Secondary | ICD-10-CM | POA: Diagnosis not present

## 2019-06-12 DIAGNOSIS — R438 Other disturbances of smell and taste: Secondary | ICD-10-CM | POA: Diagnosis not present

## 2020-02-03 ENCOUNTER — Other Ambulatory Visit: Payer: Self-pay | Admitting: *Deleted

## 2020-02-03 DIAGNOSIS — C8522 Mediastinal (thymic) large B-cell lymphoma, intrathoracic lymph nodes: Secondary | ICD-10-CM

## 2020-02-04 ENCOUNTER — Other Ambulatory Visit: Payer: Self-pay | Admitting: Hematology and Oncology

## 2020-02-04 DIAGNOSIS — R5383 Other fatigue: Secondary | ICD-10-CM

## 2020-02-04 DIAGNOSIS — C8522 Mediastinal (thymic) large B-cell lymphoma, intrathoracic lymph nodes: Secondary | ICD-10-CM

## 2020-02-05 ENCOUNTER — Inpatient Hospital Stay: Payer: Medicaid Other

## 2020-02-05 ENCOUNTER — Inpatient Hospital Stay: Payer: Medicaid Other | Admitting: Hematology and Oncology

## 2020-02-05 ENCOUNTER — Encounter: Payer: Self-pay | Admitting: Hematology and Oncology

## 2020-02-12 ENCOUNTER — Inpatient Hospital Stay: Payer: Medicaid Other

## 2020-02-12 ENCOUNTER — Inpatient Hospital Stay: Payer: Medicaid Other | Admitting: Hematology and Oncology

## 2020-02-18 ENCOUNTER — Inpatient Hospital Stay: Payer: Self-pay | Attending: Hematology and Oncology

## 2020-02-18 ENCOUNTER — Telehealth: Payer: Self-pay | Admitting: *Deleted

## 2020-02-18 ENCOUNTER — Inpatient Hospital Stay (HOSPITAL_BASED_OUTPATIENT_CLINIC_OR_DEPARTMENT_OTHER): Payer: Self-pay | Admitting: Hematology and Oncology

## 2020-02-18 ENCOUNTER — Encounter: Payer: Self-pay | Admitting: Hematology and Oncology

## 2020-02-18 ENCOUNTER — Other Ambulatory Visit: Payer: Self-pay

## 2020-02-18 VITALS — BP 119/92 | HR 88 | Temp 98.9°F | Resp 18 | Ht 62.0 in | Wt 119.4 lb

## 2020-02-18 DIAGNOSIS — C8522 Mediastinal (thymic) large B-cell lymphoma, intrathoracic lymph nodes: Secondary | ICD-10-CM

## 2020-02-18 DIAGNOSIS — R911 Solitary pulmonary nodule: Secondary | ICD-10-CM | POA: Insufficient documentation

## 2020-02-18 DIAGNOSIS — Z79899 Other long term (current) drug therapy: Secondary | ICD-10-CM | POA: Insufficient documentation

## 2020-02-18 DIAGNOSIS — Z1239 Encounter for other screening for malignant neoplasm of breast: Secondary | ICD-10-CM

## 2020-02-18 DIAGNOSIS — Z8759 Personal history of other complications of pregnancy, childbirth and the puerperium: Secondary | ICD-10-CM

## 2020-02-18 DIAGNOSIS — Z8572 Personal history of non-Hodgkin lymphomas: Secondary | ICD-10-CM | POA: Insufficient documentation

## 2020-02-18 DIAGNOSIS — R5383 Other fatigue: Secondary | ICD-10-CM

## 2020-02-18 DIAGNOSIS — N6311 Unspecified lump in the right breast, upper outer quadrant: Secondary | ICD-10-CM

## 2020-02-18 DIAGNOSIS — Z923 Personal history of irradiation: Secondary | ICD-10-CM | POA: Insufficient documentation

## 2020-02-18 DIAGNOSIS — Z299 Encounter for prophylactic measures, unspecified: Secondary | ICD-10-CM

## 2020-02-18 LAB — COMPREHENSIVE METABOLIC PANEL
ALT: 25 U/L (ref 0–44)
AST: 25 U/L (ref 15–41)
Albumin: 4.1 g/dL (ref 3.5–5.0)
Alkaline Phosphatase: 78 U/L (ref 38–126)
Anion gap: 9 (ref 5–15)
BUN: 9 mg/dL (ref 6–20)
CO2: 25 mmol/L (ref 22–32)
Calcium: 10.1 mg/dL (ref 8.9–10.3)
Chloride: 105 mmol/L (ref 98–111)
Creatinine, Ser: 0.77 mg/dL (ref 0.44–1.00)
GFR calc Af Amer: >60 mL/min (ref >60)
GFR calc non Af Amer: >60 mL/min (ref >60)
Glucose, Bld: 54 mg/dL — ABNORMAL LOW (ref 70–99)
Potassium: 3.8 mmol/L (ref 3.5–5.1)
Sodium: 139 mmol/L (ref 135–145)
Total Bilirubin: <0.2 mg/dL — ABNORMAL LOW (ref 0.3–1.2)
Total Protein: 7.4 g/dL (ref 6.5–8.1)

## 2020-02-18 LAB — CBC WITH DIFFERENTIAL/PLATELET
Abs Immature Granulocytes: 0.02 10*3/uL (ref 0.00–0.07)
Basophils Absolute: 0 10*3/uL (ref 0.0–0.1)
Basophils Relative: 1 %
Eosinophils Absolute: 0.1 10*3/uL (ref 0.0–0.5)
Eosinophils Relative: 1 %
HCT: 35 % — ABNORMAL LOW (ref 36.0–46.0)
Hemoglobin: 12 g/dL (ref 12.0–15.0)
Immature Granulocytes: 0 %
Lymphocytes Relative: 12 %
Lymphs Abs: 0.9 10*3/uL (ref 0.7–4.0)
MCH: 32.5 pg (ref 26.0–34.0)
MCHC: 34.3 g/dL (ref 30.0–36.0)
MCV: 94.9 fL (ref 80.0–100.0)
Monocytes Absolute: 0.7 10*3/uL (ref 0.1–1.0)
Monocytes Relative: 9 %
Neutro Abs: 5.5 10*3/uL (ref 1.7–7.7)
Neutrophils Relative %: 77 %
Platelets: 226 10*3/uL (ref 150–400)
RBC: 3.69 MIL/uL — ABNORMAL LOW (ref 3.87–5.11)
RDW: 13.3 % (ref 11.5–15.5)
WBC: 7.2 10*3/uL (ref 4.0–10.5)
nRBC: 0 % (ref 0.0–0.2)

## 2020-02-18 LAB — TSH: TSH: 0.386 u[IU]/mL (ref 0.308–3.960)

## 2020-02-18 LAB — LACTATE DEHYDROGENASE: LDH: 122 U/L (ref 98–192)

## 2020-02-18 NOTE — Progress Notes (Signed)
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[place]) Clinically, she has no signs of cancer recurrence I do not feel it is beneficial to continue on surveillance imaging study moving forward I will continue history, physical examination and blood work once a year We discussed the importance of influenza vaccination  History of miscarriage She wants more children There is no contraindication for her to try to become pregnant from my standpoint She might need early referral to high risk maternal fetal medicine clinic if she becomes pregnant again  Preventive measure I recommend early breast cancer screening due to radiation exposure to her chest   Orders Placed This Encounter  Procedures  . MM DIGITAL SCREENING BILATERAL    Standing Status:   Future    Standing Expiration Date:   02/17/2021    Order Specific Question:   Reason for exam:    Answer:   high risk for breast ca due to prior radiation, need early screening    Order Specific Question:   Preferred imaging location?    Answer:   [facility]    All questions were answered. The patient knows to call the clinic with any problems, questions or concerns. The total time spent in the appointment was 20 minutes encounter with patients including review of chart and various tests results, discussions about plan of care and coordination of care plan   Heath Lark, MD 02/18/2020 3:14 PM  INTERVAL HISTORY: Please see below for problem oriented charting. 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 Given the appearance and patient's age, this may represent an unusually prominent hyperplastic thymus, versus thymoma, lymphoma, etc. Assuming prior exams are not available to confirm stability, recommend chest MR with and without contrast including in and out of phase imaging for further evaluation   09/14/2014 Imaging   MR chest at [facility] showed partially solid, partially cystic anterior mediastinal mass. The cystic component is new compared to the prior study 07/07/2014 and has mural nodularity. This may represent internal hemorrhage. The cystic component mural nodularity. The chemical shift ratio does not favor benign thymic hyperplasia. The differential is led by cystic thymoma. Lymphoma and germ cell tumor are also considerations, but felt less likely. If tissue diagnosis is not obtained, recommend 3 month follow-up thoracic MRI with contrast material.   12/21/2014 Imaging   MR chest at [facility] showed a soft tissue mass within the prevascular space of the anterior mediastinum. Note that this examination is limited due to suboptimal technique and the lack of intravenous contrast. Within this context, this lesion is stable to perhaps slightly decreased in size from the comparison study.The previously seen cystic component of this lesion is no longer as well identified, however this is most likely due to suboptimal technique.   04/10/2015 Imaging   CT chest no evidence of pulmonary embolus or right heart strain. Prominent central pulmonary artery trunk with no evidence of central pulmonary artery enlargement. Large anterior and right-sided mediastinal mass. Differential diagnosis includes thymoma, teratoma, lymphoma, and less likely extra medullary hematopoiesis Small right greater than left  pleural effusions and peribronchial streaky opacities bilateral lower lobes subsegmental atelectasis or infiltrate   04/27/2015 Imaging   MR chest showed enlarging bilobed  enhancing anterior mediastinal mass with central necrosis within the right-sided component. Broad abutment of the pericardium is concerning for possible invasion. There is possible encasement of the right mammary vessels. No definite evidence of vascular invasion of the SVC, left brachiocephalic vein, or mammary vessels despite the close proximity of the mass. Based on the patient's age, this remains concerning for germ cell tumor, thymoma, or lymphoma. Consider further evaluation with direct tissue sampling and germ cell tumor markers.   05/04/2015 PET scan   PET scan at [facility] showed hypermetabolic bilobed anterior mediastinal mass with central necrosis in the right sided component is concerning for malignancy with differential including germ cell tumor, thymoma/thymic carcinoma, or lymphoma. There is no evidence of FDG avid disease elsewhere in the chest, abdomen, or pelvis.   05/16/2015 Pathology Results   Mediastinal LN biopsy: The sections show a diffuse proliferation of medium to large-sized lymphoid cells with round and irregular nuclear contours and abundant eosinophilic to clear cytoplasm. Occasional multi-lobated cells are seen. Scattered mitotic figures and apoptotic bodies are present. There are thin bands of compartmentalizing fibrosis within the infiltrate in addition to thick bands of collagen fibrosis. After the initial morphologic evaluation, immunohistochemical stains are performed on block C1 (control tissue reacts properly). The abnormal lymphoid cells are B-cells, which stain with CD20, PAX-5 and CD79a. The abnormal cells are positive for CD23 and MUM-1 and are negative for CD10. CD3 and CD5 highlight many intermixed small T-cells. Approximately 10-20% of the cells express BCL-6, while the majority appear negative for BCL-2. The Ki-67 proliferation index is approximately 30-40%. The cytokeratin AE1/AE3 stain highlights background mesothelial cells. CD30 shows patchy, weak staining of the  abnormal lymphoid cells, while CD15 appears to highlight granulocytic cells.        05/25/2015 Initial Diagnosis   She was [facility] at [facility]   05/30/2015 Imaging   Outside [facility] showed normal left ventricular wall motion. The left ventricular ejection fraction is 66.2%.   06/01/2015 Bone Marrow Biopsy   Outside bone marrow biopsy was negative   06/13/2015 - 09/14/2015 Chemotherapy   The patient had 5 cycles of RCHOP chemotherapy treatment at [facility]     07/22/2015 Imaging   CT chest showed residual solid anterior mediastinal mass, decreased in size from recent PET/CT. Residual or recurrent tumor is not excluded.   08/12/2015 PET scan   Decreased metabolic activity of the anterior mediastinal lymph nodes. No new sites of hypermetabolic disease. ([place] 3). Mild radiotracer uptake in the right upper anterior chest wall skin, which is likely related to focus of inflammation or sequelae of trauma.   09/29/2015 PET scan   Interval slight decrease in FDG activity in the dominant anterior mediastinal mass. A smaller adjacent mediastinal mass is unchanged from prior study. No evidence of new FDG avid disease ([place] 3). Interval decrease in FDG activity in the superficial tissues of the right anterior chest wall, favored to represent inflammatory change.   11/08/2015 - 12/09/2015 Radiation Therapy   She received radiation treatment to anterior Mediastinum treated to 41.40 Gy in 23 fractions   02/21/2016 PET scan   Since the PET of 09/28/2015, decreased size of an anterior mediastinal mass with unchanged hypermetabolism, similar to the mediastinal pool. ([place] 2). No this new sites of disease identified   07/20/2016 Imaging   CT chest showed partially calcified anterior mediastinal mass is  decreased in size when compared with study from 07/22/2015 and 02/21/2016. 2. 3 mm nodule in the right upper lobe is identified. Not definitely seen on previous exam which may reflect differences in  technique. Attention on follow-up imaging advise. 3. New paramediastinal fibrotic changes within both upper lobes compatible with changes of external beam radiation.   08/22/2017 Imaging   1. Slight decrease in volume of previously partially calcified anterior mediastinal mass compatible with treated tumor. 2. No new sites of disease. 3. 4 mm right upper lobe lung nodule, unchanged.   03/10/2018 Imaging   Continued further minimal decrease in size of the partially calcified anterior mediastinal lesion, compatible with treated disease. No new or progressive findings.  Stable tiny right upper lobe pulmonary nodule.   07/08/2018 Imaging   1. Stable partially calcified anterior mediastinal mass most compatible with treated lymphoma. 2. No acute process within the chest.     REVIEW OF SYSTEMS:   Constitutional: Denies fevers, chills or abnormal weight loss Eyes: Denies blurriness of vision Ears, nose, mouth, throat, and face: Denies mucositis or sore throat Respiratory: Denies cough, dyspnea or wheezes Cardiovascular: Denies palpitation, chest discomfort or lower extremity swelling Gastrointestinal:  Denies nausea, heartburn or change in bowel habits Skin: Denies abnormal skin rashes Lymphatics: Denies new lymphadenopathy or easy bruising Neurological:Denies numbness, tingling or new weaknesses Behavioral/Psych: Mood is stable, no new changes  All other systems were reviewed with the patient and are negative.  I have reviewed the past medical history, past surgical history, social history and family history with the patient and they are unchanged from previous note.  ALLERGIES:  has No Known Allergies.  MEDICATIONS:  Current Outpatient Medications  Medication Sig Dispense Refill  . acetaminophen (TYLENOL) 500 MG tablet Take 1,000 mg by mouth daily as needed (PAIN).    Marland Kitchen cyclobenzaprine (FLEXERIL) 10 MG tablet Take 1 tablet (10 mg total) by mouth 3 (three) times daily as needed for  muscle spasms. 30 tablet 0  . ibuprofen (ADVIL,MOTRIN) 200 MG tablet Take 600 mg by mouth daily as needed (PAIN).     No current facility-administered medications for this visit.    PHYSICAL EXAMINATION: ECOG PERFORMANCE STATUS: 0 - Asymptomatic  Vitals:   02/18/20 1212  BP: (!) 119/92  Pulse: 88  Resp: 18  Temp: 98.9 F (37.2 C)  SpO2: 100%   Filed Weights   02/18/20 1212  Weight: 119 lb 6.4 oz (54.2 kg)    GENERAL:alert, no distress and comfortable SKIN: skin color, texture, turgor are normal, no rashes or significant lesions EYES: normal, Conjunctiva are pink and non-injected, sclera clear OROPHARYNX:no exudate, no erythema and lips, buccal mucosa, and tongue normal  NECK: supple, thyroid normal size, non-tender, without nodularity LYMPH:  no palpable lymphadenopathy in the cervical, axillary or inguinal LUNGS: clear to auscultation and percussion with normal breathing effort HEART: regular rate & rhythm and no murmurs and no lower extremity edema ABDOMEN:abdomen soft, non-tender and normal bowel sounds Musculoskeletal:no cyanosis of digits and no clubbing  NEURO: alert & oriented x 3 with fluent speech, no focal motor/sensory deficits  LABORATORY DATA:  I have reviewed the data as listed    Component Value Date/Time   NA 139 02/18/2020 1152   NA 139 04/08/2017 0912   K 3.8 02/18/2020 1152   K 3.1 (L) 04/08/2017 0912   CL 105 02/18/2020 1152   CO2 25 02/18/2020 1152   CO2 25 04/08/2017 0912   GLUCOSE 54 (L) 02/18/2020 1152   GLUCOSE 55 (  L) 04/08/2017 0912   BUN 9 02/18/2020 1152   BUN 9.4 04/08/2017 0912   CREATININE 0.77 02/18/2020 1152   CREATININE 0.9 04/08/2017 0912   CALCIUM 10.1 02/18/2020 1152   CALCIUM 9.8 04/08/2017 0912   PROT 7.4 02/18/2020 1152   PROT 7.8 04/08/2017 0912   ALBUMIN 4.1 02/18/2020 1152   ALBUMIN 4.4 04/08/2017 0912   AST 25 02/18/2020 1152   AST 21 04/08/2017 0912   ALT 25 02/18/2020 1152   ALT 19 04/08/2017 0912   ALKPHOS 78  02/18/2020 1152   ALKPHOS 71 04/08/2017 0912   BILITOT <0.2 (L) 02/18/2020 1152   BILITOT 0.61 04/08/2017 0912   GFRNONAA >60 02/18/2020 1152   GFRAA >60 02/18/2020 1152    No results found for: SPEP, UPEP  Lab Results  Component Value Date   WBC 7.2 02/18/2020   NEUTROABS 5.5 02/18/2020   HGB 12.0 02/18/2020   HCT 35.0 (L) 02/18/2020   MCV 94.9 02/18/2020   PLT 226 02/18/2020      Chemistry      Component Value Date/Time   NA 139 02/18/2020 1152   NA 139 04/08/2017 0912   K 3.8 02/18/2020 1152   K 3.1 (L) 04/08/2017 0912   CL 105 02/18/2020 1152   CO2 25 02/18/2020 1152   CO2 25 04/08/2017 0912   BUN 9 02/18/2020 1152   BUN 9.4 04/08/2017 0912   CREATININE 0.77 02/18/2020 1152   CREATININE 0.9 04/08/2017 0912      Component Value Date/Time   CALCIUM 10.1 02/18/2020 1152   CALCIUM 9.8 04/08/2017 0912   ALKPHOS 78 02/18/2020 1152   ALKPHOS 71 04/08/2017 0912   AST 25 02/18/2020 1152   AST 21 04/08/2017 0912   ALT 25 02/18/2020 1152   ALT 19 04/08/2017 0912   BILITOT <0.2 (L) 02/18/2020 1152   BILITOT 0.61 04/08/2017 0912

## 2020-02-18 NOTE — Telephone Encounter (Signed)
-----   Message from Heath Lark, MD sent at 02/18/2020  1:50 PM EDT ----- Regarding: pls call and let her know all test results are good

## 2020-02-18 NOTE — Assessment & Plan Note (Signed)
Clinically, she has no signs of cancer recurrence I do not feel it is beneficial to continue on surveillance imaging study moving forward I will continue history, physical examination and blood work once a year We discussed the importance of influenza vaccination

## 2020-02-18 NOTE — Assessment & Plan Note (Signed)
I recommend early breast cancer screening due to radiation exposure to her chest

## 2020-02-18 NOTE — Telephone Encounter (Signed)
Notified pt that labs were good per Dr Alvy Bimler.  She expressed understanding & appreciation for call.

## 2020-02-18 NOTE — Assessment & Plan Note (Signed)
She wants more children There is no contraindication for her to try to become pregnant from my standpoint She might need early referral to high risk maternal fetal medicine clinic if she becomes pregnant again

## 2020-03-24 ENCOUNTER — Ambulatory Visit: Payer: Medicaid Other

## 2020-03-28 ENCOUNTER — Ambulatory Visit: Payer: Medicaid Other

## 2020-04-15 ENCOUNTER — Inpatient Hospital Stay: Admission: RE | Admit: 2020-04-15 | Payer: Medicaid Other | Source: Ambulatory Visit

## 2021-02-16 ENCOUNTER — Ambulatory Visit: Payer: Medicaid Other | Admitting: Hematology and Oncology

## 2021-02-16 ENCOUNTER — Other Ambulatory Visit: Payer: Medicaid Other

## 2021-02-28 ENCOUNTER — Ambulatory Visit: Payer: Medicaid Other | Admitting: Hematology and Oncology

## 2021-02-28 ENCOUNTER — Other Ambulatory Visit: Payer: Medicaid Other

## 2021-03-07 ENCOUNTER — Other Ambulatory Visit: Payer: Medicaid Other

## 2021-03-07 ENCOUNTER — Ambulatory Visit: Payer: Medicaid Other | Admitting: Hematology and Oncology

## 2022-03-27 ENCOUNTER — Other Ambulatory Visit: Payer: Self-pay | Admitting: Hematology and Oncology

## 2022-03-27 DIAGNOSIS — C8522 Mediastinal (thymic) large B-cell lymphoma, intrathoracic lymph nodes: Secondary | ICD-10-CM

## 2022-03-29 ENCOUNTER — Inpatient Hospital Stay: Payer: Medicaid Other

## 2022-03-29 ENCOUNTER — Encounter: Payer: Self-pay | Admitting: Hematology and Oncology

## 2022-03-29 ENCOUNTER — Other Ambulatory Visit: Payer: Self-pay

## 2022-03-29 ENCOUNTER — Inpatient Hospital Stay (HOSPITAL_BASED_OUTPATIENT_CLINIC_OR_DEPARTMENT_OTHER): Payer: Medicaid Other | Admitting: Hematology and Oncology

## 2022-03-29 ENCOUNTER — Inpatient Hospital Stay: Payer: Medicaid Other | Attending: Hematology and Oncology

## 2022-03-29 VITALS — BP 129/85 | HR 96 | Temp 97.9°F | Resp 18 | Ht 62.0 in | Wt 115.8 lb

## 2022-03-29 DIAGNOSIS — C8522 Mediastinal (thymic) large B-cell lymphoma, intrathoracic lymph nodes: Secondary | ICD-10-CM

## 2022-03-29 DIAGNOSIS — N6032 Fibrosclerosis of left breast: Secondary | ICD-10-CM | POA: Diagnosis not present

## 2022-03-29 DIAGNOSIS — R002 Palpitations: Secondary | ICD-10-CM

## 2022-03-29 DIAGNOSIS — M2550 Pain in unspecified joint: Secondary | ICD-10-CM | POA: Diagnosis not present

## 2022-03-29 DIAGNOSIS — E559 Vitamin D deficiency, unspecified: Secondary | ICD-10-CM

## 2022-03-29 DIAGNOSIS — N6031 Fibrosclerosis of right breast: Secondary | ICD-10-CM | POA: Insufficient documentation

## 2022-03-29 DIAGNOSIS — R911 Solitary pulmonary nodule: Secondary | ICD-10-CM | POA: Diagnosis not present

## 2022-03-29 DIAGNOSIS — C8331 Diffuse large B-cell lymphoma, lymph nodes of head, face, and neck: Secondary | ICD-10-CM | POA: Insufficient documentation

## 2022-03-29 DIAGNOSIS — Z1239 Encounter for other screening for malignant neoplasm of breast: Secondary | ICD-10-CM

## 2022-03-29 LAB — CMP (CANCER CENTER ONLY)
ALT: 21 U/L (ref 0–44)
AST: 27 U/L (ref 15–41)
Albumin: 4.8 g/dL (ref 3.5–5.0)
Alkaline Phosphatase: 53 U/L (ref 38–126)
Anion gap: 7 (ref 5–15)
BUN: 12 mg/dL (ref 6–20)
CO2: 26 mmol/L (ref 22–32)
Calcium: 9.7 mg/dL (ref 8.9–10.3)
Chloride: 102 mmol/L (ref 98–111)
Creatinine: 0.8 mg/dL (ref 0.44–1.00)
GFR, Estimated: >60 mL/min (ref >60)
Glucose, Bld: 95 mg/dL (ref 70–99)
Potassium: 4 mmol/L (ref 3.5–5.1)
Sodium: 135 mmol/L (ref 135–145)
Total Bilirubin: 0.7 mg/dL (ref 0.3–1.2)
Total Protein: 8.2 g/dL — ABNORMAL HIGH (ref 6.5–8.1)

## 2022-03-29 LAB — TSH: TSH: 1.234 u[IU]/mL (ref 0.350–4.500)

## 2022-03-29 LAB — CBC WITH DIFFERENTIAL (CANCER CENTER ONLY)
Abs Immature Granulocytes: 0.01 10*3/uL (ref 0.00–0.07)
Basophils Absolute: 0 10*3/uL (ref 0.0–0.1)
Basophils Relative: 1 %
Eosinophils Absolute: 0.1 10*3/uL (ref 0.0–0.5)
Eosinophils Relative: 1 %
HCT: 35.7 % — ABNORMAL LOW (ref 36.0–46.0)
Hemoglobin: 12.8 g/dL (ref 12.0–15.0)
Immature Granulocytes: 0 %
Lymphocytes Relative: 15 %
Lymphs Abs: 1 10*3/uL (ref 0.7–4.0)
MCH: 33.4 pg (ref 26.0–34.0)
MCHC: 35.9 g/dL (ref 30.0–36.0)
MCV: 93.2 fL (ref 80.0–100.0)
Monocytes Absolute: 0.5 10*3/uL (ref 0.1–1.0)
Monocytes Relative: 8 %
Neutro Abs: 4.9 10*3/uL (ref 1.7–7.7)
Neutrophils Relative %: 75 %
Platelet Count: 245 10*3/uL (ref 150–400)
RBC: 3.83 MIL/uL — ABNORMAL LOW (ref 3.87–5.11)
RDW: 13.7 % (ref 11.5–15.5)
WBC Count: 6.5 10*3/uL (ref 4.0–10.5)
nRBC: 0 % (ref 0.0–0.2)

## 2022-03-29 LAB — VITAMIN D 25 HYDROXY (VIT D DEFICIENCY, FRACTURES): Vit D, 25-Hydroxy: 16.55 ng/mL — ABNORMAL LOW (ref 30–100)

## 2022-03-29 NOTE — Progress Notes (Signed)
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 She has weird atypical chest discomfort that comes and goes as well as skin discoloration near her breast I suspect this is related to bilateral nipple piercing I plan to order CT imaging of the chest for evaluation  Breast cancer screening, high risk patient She is at high risk for breast cancer She is overdue for mammogram We will proceed with CT imaging first  Palpitation I will order TSH to rule out abnormal thyroid function as a cause of her palpitation  Joint pain She has nonspecific joint pain I recommend checking vitamin D level to rule out vitamin D deficiency as a cause of her pain  Orders Placed This Encounter  Procedures   CT CHEST W CONTRAST    Standing Status:   Future    Standing Expiration Date:   03/30/2023    Order Specific Question:   If indicated for the ordered procedure, I authorize the administration of contrast media per Radiology protocol    Answer:   Yes    Order Specific Question:   Preferred imaging location?    Answer:   [facility]    Order Specific Question:   Radiology Contrast Protocol - do NOT remove file path    Answer:   [email or web address]    Order Specific Question:   Is patient pregnant?    Answer:   No   TSH    Standing Status:   Future    Number of Occurrences:   1    Standing Expiration Date:   03/30/2023   VITAMIN D 25 Hydroxy (Vit-D Deficiency, Fractures)    Standing Status:   Future    Number of Occurrences:   1    Standing Expiration Date:   03/30/2023    All questions were answered. The patient knows to call the clinic with any problems, questions or concerns. The total time spent in the appointment was 30 minutes encounter  with patients including review of chart and various tests results, discussions about plan of care and coordination of care plan   Heath Lark, MD 03/29/2022 1:18 PM  INTERVAL HISTORY: Please see below for problem oriented charting. she returns for surveillance follow-up for history of mediastinal B-cell lymphoma Over the last 6 months or so, she has intermittent chest discomfort, tingling sensation that comes and goes radiating to her arm as well as changes to her skin Apparently, her symptoms precedes bilateral nipple piercings No recent lymphadenopathy  REVIEW OF SYSTEMS:   Constitutional: Denies fevers, chills or abnormal weight loss Eyes: Denies blurriness of vision Ears, nose, mouth, throat, and face: Denies mucositis or sore throat Respiratory: Denies cough, dyspnea or wheezes Cardiovascular: Denies palpitation, chest discomfort or lower extremity swelling Gastrointestinal:  Denies nausea, heartburn or change in bowel habits Skin: Denies abnormal skin rashes Lymphatics: Denies new lymphadenopathy or easy bruising Neurological:Denies numbness, tingling or new weaknesses Behavioral/Psych: Mood is stable, no new changes  All other systems were reviewed with the patient and are negative.  I have reviewed the past medical history, past surgical history, social history and family history with the patient and they are unchanged from previous note.  ALLERGIES:  has No Known Allergies.  MEDICATIONS:  Current Outpatient Medications  Medication Sig Dispense Refill   acetaminophen (TYLENOL) 500  MG tablet Take 1,000 mg by mouth daily as needed (PAIN).     cyclobenzaprine (FLEXERIL) 10 MG tablet Take 1 tablet (10 mg total) by mouth 3 (three) times daily as needed for muscle spasms. 30 tablet 0   ibuprofen (ADVIL,MOTRIN) 200 MG tablet Take 600 mg by mouth daily as needed (PAIN).     No current facility-administered medications for this visit.    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 Given the appearance and patient's age, this may represent an unusually prominent hyperplastic thymus, versus thymoma, lymphoma, etc. Assuming prior exams are not available to confirm stability, recommend chest MR with and without contrast including in and out of phase imaging for further evaluation   09/14/2014 Imaging   MR chest at [facility] showed partially solid, partially cystic anterior mediastinal mass. The cystic component is new compared to the prior study 07/07/2014 and has mural nodularity. This may represent internal hemorrhage. The cystic component mural nodularity. The chemical shift ratio does not favor benign thymic hyperplasia. The differential is led by cystic thymoma. Lymphoma and germ cell tumor are also considerations, but felt less likely. If tissue diagnosis is not obtained, recommend 3 month follow-up thoracic MRI with contrast material.   12/21/2014 Imaging   MR chest at [facility] showed a soft tissue mass within the prevascular space of the anterior mediastinum. Note that this examination is limited due to suboptimal technique and the lack of intravenous contrast. Within this context, this lesion is stable to perhaps slightly decreased in size from the comparison study.  The previously seen cystic component of this lesion is no longer as well identified, however this is most likely due to suboptimal technique.     04/10/2015 Imaging   CT chest no evidence of pulmonary embolus or right heart strain. Prominent central pulmonary artery trunk with no evidence of central pulmonary artery enlargement. Large anterior and right-sided mediastinal mass. Differential diagnosis includes thymoma, teratoma, lymphoma, and less likely extra medullary hematopoiesis Small right greater than left pleural effusions and peribronchial streaky opacities bilateral  lower lobes subsegmental atelectasis or infiltrate   04/27/2015 Imaging   MR chest showed enlarging bilobed enhancing anterior mediastinal mass with central necrosis within the right-sided component. Broad abutment of the pericardium is concerning for possible invasion. There is possible encasement of the right mammary vessels. No definite evidence of vascular invasion of the SVC, left brachiocephalic vein, or mammary vessels despite the close proximity of the mass. Based on the patient's age, this remains concerning for germ cell tumor, thymoma, or lymphoma. Consider further evaluation with direct tissue sampling and germ cell tumor markers.   05/04/2015 PET scan   PET scan at [facility] showed hypermetabolic bilobed anterior mediastinal mass with central necrosis in the right sided component is concerning for malignancy with differential including germ cell tumor, thymoma/thymic carcinoma, or lymphoma. There is no evidence of FDG avid disease elsewhere in the chest, abdomen, or pelvis.   05/16/2015 Pathology Results   Mediastinal LN biopsy: The sections show a diffuse proliferation of medium to large-sized lymphoid cells with round and irregular nuclear contours and abundant eosinophilic to clear cytoplasm. Occasional multi-lobated cells are seen. Scattered mitotic figures and apoptotic bodies are present. There are thin bands of compartmentalizing fibrosis within the infiltrate in addition to thick bands of collagen fibrosis. After the initial morphologic evaluation, immunohistochemical stains are performed on  block C1 (control tissue reacts properly). The abnormal lymphoid cells are B-cells, which stain with CD20, PAX-5 and CD79a. The abnormal cells are positive for CD23 and MUM-1 and are negative for CD10. CD3 and CD5 highlight many intermixed small T-cells. Approximately 10-20% of the cells express BCL-6, while the majority appear negative for BCL-2. The Ki-67 proliferation index is approximately 30-40%. The  cytokeratin AE1/AE3 stain highlights background mesothelial cells. CD30 shows patchy, weak staining of the abnormal lymphoid cells, while CD15 appears to highlight granulocytic cells.        05/25/2015 Initial Diagnosis   She was [facility] at [facility]   05/30/2015 Imaging   Outside [facility] showed normal left ventricular wall motion. The left ventricular ejection fraction is 66.2%.   06/01/2015 Bone Marrow Biopsy   Outside bone marrow biopsy was negative   06/13/2015 - 09/14/2015 Chemotherapy   The patient had 5 cycles of RCHOP chemotherapy treatment at [facility]     07/22/2015 Imaging   CT chest showed residual solid anterior mediastinal mass, decreased in size from recent PET/CT. Residual or recurrent tumor is not excluded.   08/12/2015 PET scan   Decreased metabolic activity of the anterior mediastinal lymph nodes. No new sites of hypermetabolic disease. ([place] 3). Mild radiotracer uptake in the right upper anterior chest wall skin, which is likely related to focus of inflammation or sequelae of trauma.   09/29/2015 PET scan   Interval slight decrease in FDG activity in the dominant anterior mediastinal mass. A smaller adjacent mediastinal mass is unchanged from prior study. No evidence of new FDG avid disease ([place] 3). Interval decrease in FDG activity in the superficial tissues of the right anterior chest wall, favored to represent inflammatory change.   11/08/2015 - 12/09/2015 Radiation Therapy   She received radiation treatment to anterior Mediastinum treated to 41.40 Gy in 23 fractions   02/21/2016 PET scan   Since the PET of 09/28/2015, decreased size of an anterior mediastinal mass with unchanged hypermetabolism, similar to the mediastinal pool. ([place] 2). No this new sites of disease identified   07/20/2016 Imaging   CT chest showed partially calcified anterior mediastinal mass is decreased in size when compared with study from 07/22/2015 and 02/21/2016. 2. 3 mm nodule in the right  upper lobe is identified. Not definitely seen on previous exam which may reflect differences in technique. Attention on follow-up imaging advise. 3. New paramediastinal fibrotic changes within both upper lobes compatible with changes of external beam radiation.   08/22/2017 Imaging   1. Slight decrease in volume of previously partially calcified anterior mediastinal mass compatible with treated tumor. 2. No new sites of disease. 3. 4 mm right upper lobe lung nodule, unchanged.   03/10/2018 Imaging   Continued further minimal decrease in size of the partially calcified anterior mediastinal lesion, compatible with treated disease. No new or progressive findings.  Stable tiny right upper lobe pulmonary nodule.   07/08/2018 Imaging   1. Stable partially calcified anterior mediastinal mass most compatible with treated lymphoma. 2. No acute process within the chest.     PHYSICAL EXAMINATION: ECOG PERFORMANCE STATUS: 1 - Symptomatic but completely ambulatory  Vitals:   03/29/22 1148  BP: 129/85  Pulse: 96  Resp: 18  Temp: 97.9 F (36.6 C)  SpO2: 100%   Filed Weights   03/29/22 1148  Weight: 115 lb 12.8 oz (52.5 kg)    GENERAL:alert, no distress and comfortable SKIN: skin color, texture, turgor are normal, no rashes or significant lesions EYES: normal, Conjunctiva are  pink and non-injected, sclera clear OROPHARYNX:no exudate, no erythema and lips, buccal mucosa, and tongue normal  NECK: supple, thyroid normal size, non-tender, without nodularity LYMPH:  no palpable lymphadenopathy in the cervical, axillary or inguinal LUNGS: clear to auscultation and percussion with normal breathing effort HEART: regular rate & rhythm and no murmurs and no lower extremity edema ABDOMEN:abdomen soft, non-tender and normal bowel sounds Musculoskeletal:no cyanosis of digits and no clubbing  NEURO: alert & oriented x 3 with fluent speech, no focal motor/sensory deficits Normal bilateral breast  examination.  Noted bilateral nipple piercings  LABORATORY DATA:  I have reviewed the data as listed    Component Value Date/Time   NA 135 03/29/2022 1130   NA 139 04/08/2017 0912   K 4.0 03/29/2022 1130   K 3.1 (L) 04/08/2017 0912   CL 102 03/29/2022 1130   CO2 26 03/29/2022 1130   CO2 25 04/08/2017 0912   GLUCOSE 95 03/29/2022 1130   GLUCOSE 55 (L) 04/08/2017 0912   BUN 12 03/29/2022 1130   BUN 9.4 04/08/2017 0912   CREATININE 0.80 03/29/2022 1130   CREATININE 0.9 04/08/2017 0912   CALCIUM 9.7 03/29/2022 1130   CALCIUM 9.8 04/08/2017 0912   PROT 8.2 (H) 03/29/2022 1130   PROT 7.8 04/08/2017 0912   ALBUMIN 4.8 03/29/2022 1130   ALBUMIN 4.4 04/08/2017 0912   AST 27 03/29/2022 1130   AST 21 04/08/2017 0912   ALT 21 03/29/2022 1130   ALT 19 04/08/2017 0912   ALKPHOS 53 03/29/2022 1130   ALKPHOS 71 04/08/2017 0912   BILITOT 0.7 03/29/2022 1130   BILITOT 0.61 04/08/2017 0912   GFRNONAA >60 03/29/2022 1130   GFRAA >60 02/18/2020 1152    No results found for: "SPEP", "UPEP"  Lab Results  Component Value Date   WBC 6.5 03/29/2022   NEUTROABS 4.9 03/29/2022   HGB 12.8 03/29/2022   HCT 35.7 (L) 03/29/2022   MCV 93.2 03/29/2022   PLT 245 03/29/2022      Chemistry      Component Value Date/Time   NA 135 03/29/2022 1130   NA 139 04/08/2017 0912   K 4.0 03/29/2022 1130   K 3.1 (L) 04/08/2017 0912   CL 102 03/29/2022 1130   CO2 26 03/29/2022 1130   CO2 25 04/08/2017 0912   BUN 12 03/29/2022 1130   BUN 9.4 04/08/2017 0912   CREATININE 0.80 03/29/2022 1130   CREATININE 0.9 04/08/2017 0912      Component Value Date/Time   CALCIUM 9.7 03/29/2022 1130   CALCIUM 9.8 04/08/2017 0912   ALKPHOS 53 03/29/2022 1130   ALKPHOS 71 04/08/2017 0912   AST 27 03/29/2022 1130   AST 21 04/08/2017 0912   ALT 21 03/29/2022 1130   ALT 19 04/08/2017 0912   BILITOT 0.7 03/29/2022 1130   BILITOT 0.61 04/08/2017 0912

## 2022-03-29 NOTE — Assessment & Plan Note (Signed)
I will order TSH to rule out abnormal thyroid function as a cause of her palpitation

## 2022-03-29 NOTE — Assessment & Plan Note (Signed)
She has nonspecific joint pain I recommend checking vitamin D level to rule out vitamin D deficiency as a cause of her pain

## 2022-03-29 NOTE — Assessment & Plan Note (Signed)
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

## 2022-03-29 NOTE — Assessment & Plan Note (Signed)
She is at high risk for breast cancer She is overdue for mammogram We will proceed with CT imaging first

## 2022-04-06 ENCOUNTER — Encounter (HOSPITAL_COMMUNITY): Payer: Self-pay

## 2022-04-06 ENCOUNTER — Ambulatory Visit (HOSPITAL_COMMUNITY): Payer: Medicaid Other | Attending: Hematology and Oncology

## 2022-04-09 ENCOUNTER — Inpatient Hospital Stay: Payer: Medicaid Other | Admitting: Hematology and Oncology

## 2022-04-09 ENCOUNTER — Telehealth: Payer: Self-pay | Admitting: *Deleted

## 2022-04-09 NOTE — Telephone Encounter (Signed)
Patient's CT r/s by patient for 04/12/22.   Scheduled for Appointment with Dr. Alvy Bimler 04/16/22 at 1145. Confirmed appt with patient.

## 2022-04-09 NOTE — Telephone Encounter (Signed)
Contacted patient. Appt scheduled with Dr. Alvy Bimler today at 1145 to review CT. CT not done per chart. She states she had emergency come up and missed CT scheduled for 10/13. She will call today to r/s asap. Said she called [place] on Friday to reach scheduling or nurse desk, but had issues with phone connecting.   Advised her to r/s CT as soon as possible - gave number for [facility] - and that office/scheduling will contact her to r/s with Dr. Alvy Bimler when CT completed. She verbalized understanding.

## 2022-04-12 ENCOUNTER — Encounter (HOSPITAL_COMMUNITY): Payer: Self-pay

## 2022-04-12 ENCOUNTER — Ambulatory Visit (HOSPITAL_COMMUNITY)
Admission: RE | Admit: 2022-04-12 | Discharge: 2022-04-12 | Disposition: A | Discharge status: Home / Self Care | Payer: Medicaid Other | Source: Ambulatory Visit | Attending: Hematology and Oncology | Admitting: Hematology and Oncology

## 2022-04-12 DIAGNOSIS — C8522 Mediastinal (thymic) large B-cell lymphoma, intrathoracic lymph nodes: Secondary | ICD-10-CM | POA: Insufficient documentation

## 2022-04-12 MED ORDER — IOHEXOL 300 MG/ML  SOLN
80.0000 mL | Freq: Once | INTRAMUSCULAR | Status: AC | PRN
  Administered 2022-04-12: 80 mL via INTRAVENOUS

## 2022-04-16 ENCOUNTER — Inpatient Hospital Stay: Payer: Medicaid Other | Admitting: Hematology and Oncology
